# Patient Record
Sex: Female | Born: 1995 | Race: Black or African American | Hispanic: No | Marital: Single | Smoking: Current some day smoker
Health system: Southern US, Community
[De-identification: ages and names within clinical notes are randomized; demographics above are authoritative.]

## PROBLEM LIST (undated history)

## (undated) ENCOUNTER — Inpatient Hospital Stay (HOSPITAL_COMMUNITY): Payer: Self-pay

## (undated) ENCOUNTER — Ambulatory Visit: Source: Home / Self Care

## (undated) DIAGNOSIS — Z3490 Encounter for supervision of normal pregnancy, unspecified, unspecified trimester: Secondary | ICD-10-CM

## (undated) DIAGNOSIS — D649 Anemia, unspecified: Secondary | ICD-10-CM

## (undated) DIAGNOSIS — N390 Urinary tract infection, site not specified: Secondary | ICD-10-CM

---
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
## 2017-01-29 ENCOUNTER — Ambulatory Visit: Payer: Medicaid Other

## 2017-02-05 ENCOUNTER — Ambulatory Visit (INDEPENDENT_AMBULATORY_CARE_PROVIDER_SITE_OTHER): Payer: Medicaid Other | Admitting: General Practice

## 2017-02-05 VITALS — BP 115/63 | HR 53 | Ht 64.0 in | Wt 93.0 lb

## 2017-02-05 DIAGNOSIS — Z3042 Encounter for surveillance of injectable contraceptive: Secondary | ICD-10-CM | POA: Diagnosis present

## 2017-02-05 MED ORDER — MEDROXYPROGESTERONE ACETATE 150 MG/ML IM SUSP
150.0000 mg | Freq: Once | INTRAMUSCULAR | Status: AC
  Administered 2017-02-05: 150 mg via INTRAMUSCULAR

## 2017-02-11 ENCOUNTER — Inpatient Hospital Stay (HOSPITAL_COMMUNITY)
Admission: AD | Admit: 2017-02-11 | Discharge: 2017-02-11 | Disposition: A | Discharge status: Home / Self Care | Payer: Medicaid Other | Source: Ambulatory Visit | Attending: Obstetrics & Gynecology | Admitting: Obstetrics & Gynecology

## 2017-02-11 ENCOUNTER — Encounter (HOSPITAL_COMMUNITY): Payer: Self-pay

## 2017-02-11 DIAGNOSIS — Z3202 Encounter for pregnancy test, result negative: Secondary | ICD-10-CM | POA: Insufficient documentation

## 2017-02-11 DIAGNOSIS — N390 Urinary tract infection, site not specified: Secondary | ICD-10-CM

## 2017-02-11 DIAGNOSIS — R300 Dysuria: Secondary | ICD-10-CM | POA: Diagnosis present

## 2017-02-11 HISTORY — DX: Anemia, unspecified: D64.9

## 2017-02-11 HISTORY — DX: Urinary tract infection, site not specified: N39.0

## 2017-02-11 LAB — URINALYSIS, ROUTINE W REFLEX MICROSCOPIC
GLUCOSE, UA: NEGATIVE mg/dL
KETONES UR: 5 mg/dL — AB
Nitrite: POSITIVE — AB
Specific Gravity, Urine: 1.033 — ABNORMAL HIGH (ref 1.005–1.030)
pH: 5 (ref 5.0–8.0)

## 2017-02-11 LAB — COMPREHENSIVE METABOLIC PANEL
ALBUMIN: 4.5 g/dL (ref 3.5–5.0)
ALK PHOS: 49 U/L (ref 38–126)
ALT: 30 U/L (ref 14–54)
AST: 23 U/L (ref 15–41)
Anion gap: 10 (ref 5–15)
BILIRUBIN TOTAL: 1.5 mg/dL — AB (ref 0.3–1.2)
BUN: 13 mg/dL (ref 6–20)
CO2: 22 mmol/L (ref 22–32)
CREATININE: 0.77 mg/dL (ref 0.44–1.00)
Calcium: 9.4 mg/dL (ref 8.9–10.3)
Chloride: 105 mmol/L (ref 101–111)
GFR calc Af Amer: >60 mL/min (ref >60)
GLUCOSE: 103 mg/dL — AB (ref 65–99)
Potassium: 4.1 mmol/L (ref 3.5–5.1)
SODIUM: 137 mmol/L (ref 135–145)
TOTAL PROTEIN: 8.1 g/dL (ref 6.5–8.1)

## 2017-02-11 LAB — CBC WITH DIFFERENTIAL/PLATELET
BASOS ABS: 0 10*3/uL (ref 0.0–0.1)
BASOS PCT: 0 %
EOS ABS: 0 10*3/uL (ref 0.0–0.7)
Eosinophils Relative: 0 %
HCT: 35.6 % — ABNORMAL LOW (ref 36.0–46.0)
Hemoglobin: 11.6 g/dL — ABNORMAL LOW (ref 12.0–15.0)
Lymphocytes Relative: 15 %
Lymphs Abs: 1.6 10*3/uL (ref 0.7–4.0)
MCH: 28.6 pg (ref 26.0–34.0)
MCHC: 32.6 g/dL (ref 30.0–36.0)
MCV: 87.7 fL (ref 78.0–100.0)
MONO ABS: 0.6 10*3/uL (ref 0.1–1.0)
MONOS PCT: 5 %
NEUTROS PCT: 80 %
Neutro Abs: 8.9 10*3/uL — ABNORMAL HIGH (ref 1.7–7.7)
Platelets: 203 10*3/uL (ref 150–400)
RBC: 4.06 MIL/uL (ref 3.87–5.11)
RDW: 14.6 % (ref 11.5–15.5)
WBC: 11.2 10*3/uL — ABNORMAL HIGH (ref 4.0–10.5)

## 2017-02-11 LAB — POCT PREGNANCY, URINE: PREG TEST UR: NEGATIVE

## 2017-02-11 MED ORDER — CIPROFLOXACIN HCL 500 MG PO TABS: 500.0000 mg | ORAL_TABLET | Freq: Two times a day (BID) | ORAL | 0 refills | Status: AC

## 2017-02-11 MED ORDER — CEFTRIAXONE SODIUM 1 G IJ SOLR
1.0000 g | Freq: Once | INTRAMUSCULAR | Status: DC
  Filled 2017-02-11: qty 10

## 2017-02-11 MED ORDER — ACETAMINOPHEN 500 MG PO TABS
1000.0000 mg | ORAL_TABLET | Freq: Once | ORAL | Status: AC
  Administered 2017-02-11: 1000 mg via ORAL
  Filled 2017-02-11: qty 2

## 2017-02-11 MED ORDER — CEFTRIAXONE SODIUM 1 G IJ SOLR
1.0000 g | Freq: Once | INTRAMUSCULAR | Status: AC
  Administered 2017-02-11: 1 g via INTRAMUSCULAR
  Filled 2017-02-11: qty 10

## 2017-02-11 NOTE — Discharge Instructions (Signed)
Pyelonephritis, Adult Pyelonephritis is a kidney infection. The kidneys are organs that help clean your blood by moving waste out of your blood and into your pee (urine). This infection can happen quickly, or it can last for a long time. In most cases, it clears up with treatment and does not cause other problems. Follow these instructions at home: Medicines  Take over-the-counter and prescription medicines only as told by your doctor.  Take your antibiotic medicine as told by your doctor. Do not stop taking the medicine even if you start to feel better. General instructions  Drink enough fluid to keep your pee clear or pale yellow.  Avoid caffeine, tea, and carbonated drinks.  Pee (urinate) often. Avoid holding in pee for long periods of time.  Pee before and after sex.  After pooping (having a bowel movement), women should wipe from front to back. Use each tissue only once.  Keep all follow-up visits as told by your doctor. This is important. Contact a doctor if:  You do not feel better after 2 days.  Your symptoms get worse.  You have a fever. Get help right away if:  You cannot take your medicine or drink fluids as told.  You have chills and shaking.  You throw up (vomit).  You have very bad pain in your side (flank) or back.  You feel very weak or you pass out (faint). This information is not intended to replace advice given to you by your health care provider. Make sure you discuss any questions you have with your health care provider. Document Released: 03/28/2004 Document Revised: 07/27/2015 Document Reviewed: 06/13/2014 Elsevier Interactive Patient Education  [employer]2018 Elsevier Inc.   In late 2019, the [facility] will be [facility] to the [facility]. At that time, the MAU (Maternity Admissions Unit), where you are being seen today, will no longer take care of non-pregnant patients. We strongly encourage you to find a doctor's office before that time, so that  you can be seen with any GYN concerns, like vaginal discharge, urinary tract infection, etc.. in a timely manner.  In order to make an office visit more convenient, the Center for [facility] at [facility] will be offering evening hours with same-day appointments, walk-in appointments and scheduled appointments available during this time.  Center for [facility] Hours: Monday - 8am - 7:30 pm with walk-in between 4pm- 7:30 pm Tuesday - 8 am - 5 pm (starting 06/03/17 we will be open late and accepting walk-ins from 4pm - 7:30pm) Wednesday - 8 am - 5 pm (starting 09/03/17 we will be open late and accepting walk-ins from 4pm - 7:30pm) Thursday 8 am - 5 pm (starting 12/04/17 we will be open late and accepting walk-ins from 4pm - 7:30pm) Friday 8 am - 5 pm  For an appointment please call the Center for [facility] @ [facility] at [phone]  For urgent needs, [facility] is also available for management of urgent GYN complaints such as vaginal discharge or urinary tract infections.  Primary care follow up  [facility] (will see you even if you do not have sickle cell): [phone] [facility]: [phone] [facility]: [phone]

## 2017-02-11 NOTE — MAU Note (Signed)
Pt started having UTI symptoms 3 days ago. Having chills and vomiting today.

## 2017-02-11 NOTE — MAU Provider Note (Signed)
History  [name]: [id number] Arrival date and time: 02/11/17 1718  First Provider Initiated Contact with Patient 02/11/17 1824      Chief Complaint  Patient presents with  . UTI symptoms    HPI: [name] is a 21 y.o. who presents to maternity admissions reporting dysuria, flank pain, and chills. She reports she has had dysuria and urinary frequency for about 2 weeks, then started having right-sided flank pain, and chills 3 days ago. Thinks she has had a fever, but did not check. Some nausea and had one episode of emesis earlier today. Denies diarrhea, vaginal discharge, vaginal bleeding, dizziness/lighreadhess, or headache.    OB History  Gravida Para Term Preterm AB Living  2 1 1   1 1   SAB TAB Ectopic Multiple Live Births  1     0 1    # Outcome Date GA Lbr Len/2nd Weight Sex Delivery Anes PTL Lv  2 Term 01/27/15 [id number]  7 lb 8.3 oz (3.41 kg) M CS-LTranv Spinal  LIV  1 SAB 2013             Past Medical History:  Diagnosis Date  . Anemia   . UTI (urinary tract infection)    Past Surgical History:  Procedure Laterality Date  . CESAREAN SECTION N/A 01/27/2015   Procedure: CESAREAN SECTION;  Surgeon: Levie HeritageJacob J Stinson, DO;  Location: [facility];  Service: Obstetrics;  Laterality: N/A;   Social History   Socioeconomic History  . Marital status: Single    Spouse name: Not on file  . Number of children: Not on file  . Years of education: Not on file  . Highest education level: Not on file  Social Needs  . Financial resource strain: Not on file  . Food insecurity - worry: Not on file  . Food insecurity - inability: Not on file  . Transportation needs - medical: Not on file  . Transportation needs - non-medical: Not on file  Occupational History  . Not on file  Tobacco Use  . Smoking status: Never Smoker  . Smokeless tobacco: Never Used  Substance and Sexual Activity  . Alcohol use: No  . Drug use: Yes    Types: Marijuana  . Sexual activity: Yes    Birth  control/protection: Condom, Injection    Comment: next Depo scheduled for April 1st  Other Topics Concern  . Not on file  Social History Narrative   ** Merged History Encounter **       No Known Allergies  No medications prior to admission.    I have reviewed patient's Past Medical Hx, Surgical Hx, Family Hx, Social Hx, medications and allergies.   Review of Systems: Negative except for what is mentioned in HPI.  Physical Exam   Blood pressure 123/67, pulse (!) 103, temperature (!) 101.3 F (38.5 C), resp. rate 16, last menstrual period 01/09/2017, SpO2 100 %.  Constitutional: Well-developed, well-nourished female in no acute distress.  HENT: [place]/AT, normal oropharynx mucosa. MMM Eyes: normal conjunctivae, no scleral icterus Cardiovascular: normal rate (90s), regular rhythm Respiratory: normal effort, lungs CTAB.  GI: Abd soft, non-tender, non-distended, normoactive bowel sounds GU: Mild right CVA tenderness.  MSK: Extremities nontender, no edema Neurologic: Alert and oriented x 4. Psych: Normal mood and affect Skin: warm and dry  MAU Course/MDM:   Nursing notes and VS reviewed.  Patient seen and examined, as noted above.  UA reviewed: c/w UTI. Urine sent for culture.  UPT negative CBC, CMP ordered.   Results  reviewed:  Results for orders placed or performed during the hospital encounter of 02/11/17  Urinalysis, Routine w reflex microscopic  Result Value Ref Range   Color, Urine AMBER (A) YELLOW   APPearance HAZY (A) CLEAR   Specific Gravity, Urine 1.033 (H) 1.005 - 1.030   pH 5.0 5.0 - 8.0   Glucose, UA NEGATIVE NEGATIVE mg/dL   Hgb urine dipstick MODERATE (A) NEGATIVE   Bilirubin Urine SMALL (A) NEGATIVE   Ketones, ur 5 (A) NEGATIVE mg/dL   Protein, ur >=811 (A) NEGATIVE mg/dL   Nitrite POSITIVE (A) NEGATIVE   Leukocytes, UA MODERATE (A) NEGATIVE   RBC / HPF TOO NUMEROUS TO COUNT 0 - 5 RBC/hpf   WBC, UA TOO NUMEROUS TO COUNT 0 - 5 WBC/hpf   Bacteria, UA  MANY (A) NONE SEEN   Squamous Epithelial / LPF 0-5 (A) NONE SEEN   Mucus PRESENT    Hyaline Casts, UA PRESENT    Non Squamous Epithelial 0-5 (A) NONE SEEN  CBC with Differential/Platelet  Result Value Ref Range   WBC 11.2 (H) 4.0 - 10.5 K/uL   RBC 4.06 3.87 - 5.11 MIL/uL   Hemoglobin 11.6 (L) 12.0 - 15.0 g/dL   HCT 91.4 (L) 78.2 - 95.6 %   MCV 87.7 78.0 - 100.0 fL   MCH 28.6 26.0 - 34.0 pg   MCHC 32.6 30.0 - 36.0 g/dL   RDW 21.3 08.6 - 57.8 %   Platelets 203 150 - 400 K/uL   Neutrophils Relative % 80 %   Neutro Abs 8.9 (H) 1.7 - 7.7 K/uL   Lymphocytes Relative 15 %   Lymphs Abs 1.6 0.7 - 4.0 K/uL   Monocytes Relative 5 %   Monocytes Absolute 0.6 0.1 - 1.0 K/uL   Eosinophils Relative 0 %   Eosinophils Absolute 0.0 0.0 - 0.7 K/uL   Basophils Relative 0 %   Basophils Absolute 0.0 0.0 - 0.1 K/uL  Comprehensive metabolic panel  Result Value Ref Range   Sodium 137 135 - 145 mmol/L   Potassium 4.1 3.5 - 5.1 mmol/L   Chloride 105 101 - 111 mmol/L   CO2 22 22 - 32 mmol/L   Glucose, Bld 103 (H) 65 - 99 mg/dL   BUN 13 6 - 20 mg/dL   Creatinine, Ser 4.69 0.44 - 1.00 mg/dL   Calcium 9.4 8.9 - 62.9 mg/dL   Total Protein 8.1 6.5 - 8.1 g/dL   Albumin 4.5 3.5 - 5.0 g/dL   AST 23 15 - 41 U/L   ALT 30 14 - 54 U/L   Alkaline Phosphatase 49 38 - 126 U/L   Total Bilirubin 1.5 (H) 0.3 - 1.2 mg/dL   GFR calc non Af Amer >60 >60 mL/min   GFR calc Af Amer >60 >60 mL/min   Anion gap 10 5 - 15  Pregnancy, urine POC  Result Value Ref Range   Preg Test, Ur NEGATIVE NEGATIVE   21 y.o. nonpregnant female presenting with signs and symptoms of acute complicated UTI. She febrile (Tmax 101.3), with heart mildly appropriately increased, which improved after treatement of fever; normal RR. mild leukocytosis (11.2k); does not meet criteria for sepsis, and qSOFA is 0 (GCS 15, RR < 22, SBP >100). UA notable for pyuria, +nitrites. Mild right CVA. Presentation c/w with acute complicated UTI, likely mild/early  pyelonephritis. Pt with no indication for hospitalization (uncontrolled pain, marked debility, inability to tolerate po, or sepsis), and is a good candidate for outpatient treatment --Rocephin 1 g  given  --Will prescribe ciprofloxin 500 mg po BID x 7 days for outpatient treatment    Assessment and Plan  Assessment: 1. Complicated UTI (urinary tract infection)     Plan: --Rocephin 1 g here followed by ciprofloxacin x 7 days outpatient --Discharge home in stable condition --Discussed return precautions at length, including pain uncontrolled with OTC analgesics, inability to tolerate po, persistent high fever, or other concerning symptoms.  [place] Haydon, Kandra NicolasJulie P, MD 02/11/2017 6:24 PM

## 2017-02-14 LAB — URINE CULTURE: Culture: >=100000 — AB

## 2017-04-23 ENCOUNTER — Ambulatory Visit (INDEPENDENT_AMBULATORY_CARE_PROVIDER_SITE_OTHER): Payer: Medicaid Other | Admitting: *Deleted

## 2017-04-23 VITALS — BP 115/72 | HR 66

## 2017-04-23 DIAGNOSIS — Z3042 Encounter for surveillance of injectable contraceptive: Secondary | ICD-10-CM | POA: Diagnosis present

## 2017-04-23 MED ORDER — MEDROXYPROGESTERONE ACETATE 150 MG/ML IM SUSP
150.0000 mg | Freq: Once | INTRAMUSCULAR | Status: AC
  Administered 2017-04-23: 150 mg via INTRAMUSCULAR

## 2017-04-23 NOTE — Progress Notes (Signed)
I have reviewed this chart and agree with the RN/CMA assessment and management.    K. Meryl Davis, M.D. Attending Obstetrician & Gynecologist, [facility] Practice [facility], [facility]  

## 2017-06-01 ENCOUNTER — Inpatient Hospital Stay (HOSPITAL_COMMUNITY)
Admission: AD | Admit: 2017-06-01 | Discharge: 2017-06-01 | Disposition: A | Discharge status: Home / Self Care | Payer: Medicaid Other | Source: Ambulatory Visit | Attending: Obstetrics and Gynecology | Admitting: Obstetrics and Gynecology

## 2017-06-01 DIAGNOSIS — R300 Dysuria: Secondary | ICD-10-CM | POA: Diagnosis present

## 2017-06-01 LAB — URINALYSIS, ROUTINE W REFLEX MICROSCOPIC
BILIRUBIN URINE: NEGATIVE
GLUCOSE, UA: NEGATIVE mg/dL
KETONES UR: NEGATIVE mg/dL
Nitrite: NEGATIVE
PH: 7 (ref 5.0–8.0)
Protein, ur: NEGATIVE mg/dL
SPECIFIC GRAVITY, URINE: 1.011 (ref 1.005–1.030)

## 2017-06-01 LAB — POCT PREGNANCY, URINE: Preg Test, Ur: NEGATIVE

## 2017-06-01 MED ORDER — NITROFURANTOIN MONOHYD MACRO 100 MG PO CAPS: 100.0000 mg | ORAL_CAPSULE | Freq: Two times a day (BID) | ORAL | 0 refills | Status: DC

## 2017-06-01 NOTE — MAU Provider Note (Signed)
THE [facility] OF [place] MATERNITY ADMISSIONS Provider Note   [name]: [id number] Arrival date & time: 06/01/17  1409     History   Chief Complaint Chief Complaint  Patient presents with  . Dysuria    HPI Monica Nash is a 22 y.o. female G2P1011 who presents to the ED with dysuria that started a few days ago. Patient reports that she has had UTI's in the past and this feels the same. Patient states she is not concerned about STI's and does not need a pelvic exam or other evaluation.   HPI  Past Medical History:  Diagnosis Date  . Anemia   . UTI (urinary tract infection)     Patient Active Problem List   Diagnosis Date Noted  . S/P primary low transverse C-section 01/27/2015    Past Surgical History:  Procedure Laterality Date  . CESAREAN SECTION N/A 01/27/2015   Procedure: CESAREAN SECTION;  Surgeon: Levie Heritage, DO;  Location: [facility];  Service: Obstetrics;  Laterality: N/A;     OB History    Gravida  2   Para  1   Term  1   Preterm      AB  1   Living  1     SAB  1   TAB      Ectopic      Multiple  0   Live Births  1            Home Medications    Prior to Admission medications   Medication Sig Start Date End Date Taking? Authorizing Provider  nitrofurantoin, macrocrystal-monohydrate, (MACROBID) 100 MG capsule Take 1 capsule (100 mg total) by mouth 2 (two) times daily. 06/01/17   Janne Napoleon, NP    Family History No family history on file.  Social History Social History   Tobacco Use  . Smoking status: Never Smoker  . Smokeless tobacco: Never Used  Substance Use Topics  . Alcohol use: No  . Drug use: Yes    Types: Marijuana     Allergies   Patient has no known allergies.   Review of Systems Review of Systems  Genitourinary: Positive for dysuria.  All other systems reviewed and are negative.    Physical Exam Updated Vital Signs BP (!) 106/46 (BP Location: Right Arm)   Pulse 75   Temp 99.1 F (37.3  C) (Oral)   Resp 16   Ht 5\' 4"  (1.626 m)   Wt 97 lb (44 kg)   BMI 16.65 kg/m   Physical Exam  Constitutional: She appears well-developed and well-nourished. No distress.  HENT:  Head: Normocephalic.  Eyes: EOM are normal.  Neck: Neck supple.  Cardiovascular: Normal rate.  Pulmonary/Chest: Effort normal.  Abdominal: Soft. There is no tenderness.  Musculoskeletal: Normal range of motion.  Neurological: She is alert.  Skin: Skin is warm and dry.  Psychiatric: She has a normal mood and affect.  Nursing note and vitals reviewed.    ED Treatments / Results  Labs (all labs ordered are listed, but only abnormal results are displayed) Labs Reviewed  URINALYSIS, ROUTINE W REFLEX MICROSCOPIC - Abnormal; Notable for the following components:      Result Value   Hgb urine dipstick SMALL (*)    Leukocytes, UA TRACE (*)    Bacteria, UA RARE (*)    Squamous Epithelial / LPF 0-5 (*)    All other components within normal limits  URINE CULTURE  POCT PREGNANCY, URINE    Initial  Impression / Assessment and Plan / ED Course  I have reviewed the triage vital signs and the nursing notes. Pt has been diagnosed with dysuria. Urinalysis no conclusive for UTI but will send for culture and start Macrobid.  Pt is afebrile, no CVA tenderness, normotensive, and denies N/V. Pt to be dc home and instructions to follow up with PCP if symptoms persist.  Final Clinical Impressions(s) / ED Diagnoses   Final diagnoses:  Dysuria    ED Discharge Orders        Ordered    nitrofurantoin, macrocrystal-monohydrate, (MACROBID) 100 MG capsule  2 times daily     06/01/17 1504    Diet - low sodium heart healthy     06/01/17 1509

## 2017-06-01 NOTE — MAU Note (Signed)
Patient presents onset of painful urination x 1 week, urinary frequency.

## 2017-06-01 NOTE — Discharge Instructions (Signed)

## 2017-06-02 LAB — URINE CULTURE: Special Requests: NORMAL

## 2017-07-09 ENCOUNTER — Ambulatory Visit (INDEPENDENT_AMBULATORY_CARE_PROVIDER_SITE_OTHER): Payer: Medicaid Other | Admitting: General Practice

## 2017-07-09 VITALS — BP 125/59 | HR 54 | Ht 64.0 in | Wt 97.0 lb

## 2017-07-09 DIAGNOSIS — Z3042 Encounter for surveillance of injectable contraceptive: Secondary | ICD-10-CM | POA: Diagnosis present

## 2017-07-09 MED ORDER — MEDROXYPROGESTERONE ACETATE 150 MG/ML IM SUSP: 150.0000 mg | INTRAMUSCULAR | 0 refills | Status: DC

## 2017-07-09 MED ORDER — MEDROXYPROGESTERONE ACETATE 150 MG/ML IM SUSP
150.0000 mg | Freq: Once | INTRAMUSCULAR | Status: AC
  Administered 2017-07-09: 150 mg via INTRAMUSCULAR

## 2017-07-09 NOTE — Progress Notes (Signed)
[name] here for Depo-Provera  Injection.  Injection administered without complication. Patient will return in 3 months for next injection.  Marylynn Pearson, RN 07/09/2017  2:39 PM

## 2017-07-10 NOTE — Progress Notes (Signed)
I have reviewed the chart and agree with nursing staff's documentation of this patient's encounter.  Moustafa Mossa, MD 07/10/2017 10:14 AM    

## 2017-10-08 ENCOUNTER — Ambulatory Visit (INDEPENDENT_AMBULATORY_CARE_PROVIDER_SITE_OTHER): Payer: Medicaid Other

## 2017-10-08 VITALS — Wt 96.1 lb

## 2017-10-08 DIAGNOSIS — Z3042 Encounter for surveillance of injectable contraceptive: Secondary | ICD-10-CM

## 2017-10-08 MED ORDER — MEDROXYPROGESTERONE ACETATE 150 MG/ML IM SUSP
150.0000 mg | Freq: Once | INTRAMUSCULAR | Status: AC
Start: 2017-10-08 — End: 2017-10-08
  Administered 2017-10-08: 150 mg via INTRAMUSCULAR

## 2017-10-08 NOTE — Progress Notes (Addendum)
Rolene CourseMoesha Feinberg here for Depo-Provera  Injection.  Injection administered without complication. Patient will return in 3 months for next injection.  Advised to pt will need annual exam to cont. Depo Injection the patient verbalized understanding.  Henrietta Dineamela S Revecca Nachtigal, CMA 10/08/2017  4:08 PM

## 2017-10-09 NOTE — Progress Notes (Signed)
Chart reviewed - agree with cma documentation.

## 2017-11-09 ENCOUNTER — Inpatient Hospital Stay (EMERGENCY_DEPARTMENT_HOSPITAL)
Admission: AD | Admit: 2017-11-09 | Discharge: 2017-11-10 | Disposition: A | Discharge status: Home / Self Care | Payer: Medicaid Other | Source: Ambulatory Visit | Attending: Obstetrics and Gynecology | Admitting: Obstetrics and Gynecology

## 2017-11-09 ENCOUNTER — Other Ambulatory Visit: Payer: Self-pay

## 2017-11-09 DIAGNOSIS — R079 Chest pain, unspecified: Secondary | ICD-10-CM | POA: Diagnosis present

## 2017-11-09 DIAGNOSIS — R0602 Shortness of breath: Secondary | ICD-10-CM | POA: Diagnosis present

## 2017-11-09 LAB — URINALYSIS, ROUTINE W REFLEX MICROSCOPIC
Glucose, UA: NEGATIVE mg/dL
KETONES UR: NEGATIVE mg/dL
LEUKOCYTES UA: NEGATIVE
Nitrite: NEGATIVE
PH: 6 (ref 5.0–8.0)
PROTEIN: 30 mg/dL — AB
Specific Gravity, Urine: 1.03 (ref 1.005–1.030)

## 2017-11-09 LAB — POCT PREGNANCY, URINE: Preg Test, Ur: NEGATIVE

## 2017-11-09 NOTE — MAU Note (Signed)
Pt not in lobby.  

## 2017-11-09 NOTE — MAU Note (Signed)
Pt reports n/v and upper abdominal pain for a couple for weeks. Pt took ibuprofen with no relief. Pt also feeling dizziness and back pain and shaky. Pt was given depo last month in clinic.

## 2017-11-09 NOTE — MAU Provider Note (Signed)
Dr. Precious Reel was paged and [phone] // TC returned @ 2341 to discuss EKG results. Dr. Katherene Ponto recommendation is that CNM send patient to [facility] for further evaluation of CP.   2345 - patient not in lobby when called to triage room.  Raelyn Mora, CNM

## 2017-11-10 ENCOUNTER — Encounter (HOSPITAL_COMMUNITY): Payer: Self-pay | Admitting: *Deleted

## 2017-11-10 ENCOUNTER — Other Ambulatory Visit: Payer: Self-pay

## 2017-11-10 ENCOUNTER — Inpatient Hospital Stay (HOSPITAL_COMMUNITY)
Admission: EM | Admit: 2017-11-10 | Discharge: 2017-11-12 | DRG: 419 | Disposition: A | Discharge status: Home / Self Care | Payer: Medicaid Other | Attending: General Surgery | Admitting: General Surgery

## 2017-11-10 DIAGNOSIS — Z793 Long term (current) use of hormonal contraceptives: Secondary | ICD-10-CM

## 2017-11-10 DIAGNOSIS — R112 Nausea with vomiting, unspecified: Secondary | ICD-10-CM | POA: Diagnosis present

## 2017-11-10 DIAGNOSIS — D649 Anemia, unspecified: Secondary | ICD-10-CM | POA: Diagnosis present

## 2017-11-10 DIAGNOSIS — R079 Chest pain, unspecified: Secondary | ICD-10-CM | POA: Diagnosis not present

## 2017-11-10 DIAGNOSIS — R740 Nonspecific elevation of levels of transaminase and lactic acid dehydrogenase [LDH]: Secondary | ICD-10-CM

## 2017-11-10 DIAGNOSIS — K8064 Calculus of gallbladder and bile duct with chronic cholecystitis without obstruction: Principal | ICD-10-CM | POA: Diagnosis present

## 2017-11-10 DIAGNOSIS — R7401 Elevation of levels of liver transaminase levels: Secondary | ICD-10-CM

## 2017-11-10 DIAGNOSIS — K8050 Calculus of bile duct without cholangitis or cholecystitis without obstruction: Secondary | ICD-10-CM | POA: Diagnosis present

## 2017-11-10 DIAGNOSIS — E876 Hypokalemia: Secondary | ICD-10-CM | POA: Diagnosis present

## 2017-11-10 DIAGNOSIS — R109 Unspecified abdominal pain: Secondary | ICD-10-CM

## 2017-11-10 DIAGNOSIS — R1011 Right upper quadrant pain: Secondary | ICD-10-CM

## 2017-11-10 DIAGNOSIS — R748 Abnormal levels of other serum enzymes: Secondary | ICD-10-CM | POA: Diagnosis present

## 2017-11-10 DIAGNOSIS — R0602 Shortness of breath: Secondary | ICD-10-CM | POA: Diagnosis present

## 2017-11-10 DIAGNOSIS — Z419 Encounter for procedure for purposes other than remedying health state, unspecified: Secondary | ICD-10-CM

## 2017-11-10 LAB — COMPREHENSIVE METABOLIC PANEL
ALBUMIN: 5.1 g/dL — AB (ref 3.5–5.0)
ALT: 508 U/L — ABNORMAL HIGH (ref 0–44)
ANION GAP: 11 (ref 5–15)
AST: 223 U/L — ABNORMAL HIGH (ref 15–41)
Alkaline Phosphatase: 125 U/L (ref 38–126)
BUN: 10 mg/dL (ref 6–20)
CO2: 28 mmol/L (ref 22–32)
Calcium: 10 mg/dL (ref 8.9–10.3)
Chloride: 103 mmol/L (ref 98–111)
Creatinine, Ser: 0.68 mg/dL (ref 0.44–1.00)
GFR calc non Af Amer: >60 mL/min (ref >60)
GLUCOSE: 98 mg/dL (ref 70–99)
POTASSIUM: 3.3 mmol/L — AB (ref 3.5–5.1)
SODIUM: 142 mmol/L (ref 135–145)
Total Bilirubin: 2.5 mg/dL — ABNORMAL HIGH (ref 0.3–1.2)
Total Protein: 8.2 g/dL — ABNORMAL HIGH (ref 6.5–8.1)

## 2017-11-10 LAB — CBC
HEMATOCRIT: 36.6 % (ref 36.0–46.0)
HEMOGLOBIN: 12.1 g/dL (ref 12.0–15.0)
MCH: 28.6 pg (ref 26.0–34.0)
MCHC: 33.1 g/dL (ref 30.0–36.0)
MCV: 86.5 fL (ref 78.0–100.0)
Platelets: 244 10*3/uL (ref 150–400)
RBC: 4.23 MIL/uL (ref 3.87–5.11)
RDW: 14.5 % (ref 11.5–15.5)
WBC: 6 10*3/uL (ref 4.0–10.5)

## 2017-11-10 LAB — URINALYSIS, ROUTINE W REFLEX MICROSCOPIC
Glucose, UA: NEGATIVE mg/dL
Ketones, ur: NEGATIVE mg/dL
Leukocytes, UA: NEGATIVE
Nitrite: NEGATIVE
Protein, ur: 100 mg/dL — AB
Specific Gravity, Urine: 1.03 (ref 1.005–1.030)
pH: 6 (ref 5.0–8.0)

## 2017-11-10 LAB — PREGNANCY, URINE: Preg Test, Ur: NEGATIVE

## 2017-11-10 LAB — LIPASE, BLOOD: Lipase: 26 U/L (ref 11–51)

## 2017-11-10 MED ORDER — KETOROLAC TROMETHAMINE 30 MG/ML IJ SOLN
30.0000 mg | Freq: Once | INTRAMUSCULAR | Status: AC
  Administered 2017-11-11: 30 mg via INTRAVENOUS
  Filled 2017-11-10: qty 1

## 2017-11-10 MED ORDER — SODIUM CHLORIDE 0.9 % IV BOLUS
1000.0000 mL | Freq: Once | INTRAVENOUS | Status: AC
  Administered 2017-11-11: 1000 mL via INTRAVENOUS

## 2017-11-10 NOTE — MAU Provider Note (Signed)
Chief Complaint: Abdominal Pain; Nausea; and Emesis   None     SUBJECTIVE HPI: [name] is a 22 y.o. G2P1011 who presents to maternity admissions for complaints of abdominal cramps, chest pain, SOB, nausea, upper abdominal pain, & lightheadedness. She states the only way to relieve the upper abdominal pain is to induce vomiting. Otherwise, she has continued upper abdominal pain. She reports "feeling jittery, too."  Past Medical History:  Diagnosis Date  . Anemia   . UTI (urinary tract infection)    Past Surgical History:  Procedure Laterality Date  . CESAREAN SECTION N/A 01/27/2015   Procedure: CESAREAN SECTION;  Surgeon: Levie Heritage, DO;  Location: [facility];  Service: Obstetrics;  Laterality: N/A;   Social History   Socioeconomic History  . Marital status: Single    Spouse name: Not on file  . Number of children: Not on file  . Years of education: Not on file  . Highest education level: Not on file  Occupational History  . Not on file  Social Needs  . Financial resource strain: Not on file  . Food insecurity:    Worry: Not on file    Inability: Not on file  . Transportation needs:    Medical: Not on file    Non-medical: Not on file  Tobacco Use  . Smoking status: Never Smoker  . Smokeless tobacco: Never Used  Substance and Sexual Activity  . Alcohol use: No  . Drug use: Yes    Types: Marijuana  . Sexual activity: Yes    Birth control/protection: Condom, Injection    Comment: next Depo scheduled for April 1st  Lifestyle  . Physical activity:    Days per week: Not on file    Minutes per session: Not on file  . Stress: Not on file  Relationships  . Social connections:    Talks on phone: Not on file    Gets together: Not on file    Attends religious service: Not on file    Active member of club or organization: Not on file    Attends meetings of clubs or organizations: Not on file    Relationship status: Not on file  . Intimate partner violence:   Fear of current or ex partner: Not on file    Emotionally abused: Not on file    Physically abused: Not on file    Forced sexual activity: Not on file  Other Topics Concern  . Not on file  Social History Narrative   ** Merged History Encounter **       No current facility-administered medications on file prior to encounter.    Current Outpatient Medications on File Prior to Encounter  Medication Sig Dispense Refill  . medroxyPROGESTERone (DEPO-PROVERA) 150 MG/ML injection Inject 150 mg into the muscle every 3 (three) months.     No Known Allergies  ROS:  Review of Systems  Constitutional: Negative.   HENT: Negative.   Eyes: Negative.   Respiratory: Positive for shortness of breath.        Chest pain  Cardiovascular: Positive for chest pain.  Gastrointestinal: Positive for abdominal pain. Abdominal distention: upper.  Endocrine: Negative.   Genitourinary: Negative.   Musculoskeletal: Negative.   Skin: Negative.   Allergic/Immunologic: Negative.   Neurological: Positive for light-headedness.  Hematological: Negative.   Psychiatric/Behavioral: Negative.     I have reviewed patient's Past Medical Hx, Surgical Hx, Family Hx, Social Hx, medications and allergies.   Physical Exam   Patient Vitals  for the past 24 hrs:  BP Temp Temp src Pulse Resp Height Weight  11/09/17 2158 114/68 98.5 F (36.9 C) Oral (!) 56 16 5\' 4"  (1.626 m) 43.1 kg   Physical Exam  Constitutional: She is oriented to person, place, and time. She appears well-developed and well-nourished.  HENT:  Head: Normocephalic and atraumatic.  Eyes: Pupils are equal, round, and reactive to light.  Neck: Normal range of motion. Neck supple.  Cardiovascular: Normal rate, regular rhythm and normal heart sounds.  Respiratory: Effort normal and breath sounds normal.    Pain in epigastric and RT upper abdomen  GI: Soft. Bowel sounds are normal.  Musculoskeletal: Normal range of motion.  Neurological: She is alert  and oriented to person, place, and time.  Skin: Skin is warm and dry.  Psychiatric: She has a normal mood and affect. Her behavior is normal. Judgment and thought content normal.    MDM Patient report continued CP & SOB with exertion and "fast heartbeat. Patient advised that she may wait for a room in MAU or may choose to be discharged to seek non-emergent evaluation of her complaint at [facility], Urgent care or her PCP.   Results for orders placed or performed during the hospital encounter of 11/09/17 (from the past 24 hour(s))  Urinalysis, Routine w reflex microscopic     Status: Abnormal   Collection Time: 11/09/17 10:01 PM  Result Value Ref Range   Color, Urine AMBER (A) YELLOW   APPearance CLEAR CLEAR   Specific Gravity, Urine 1.030 1.005 - 1.030   pH 6.0 5.0 - 8.0   Glucose, UA NEGATIVE NEGATIVE mg/dL   Hgb urine dipstick SMALL (A) NEGATIVE   Bilirubin Urine SMALL (A) NEGATIVE   Ketones, ur NEGATIVE NEGATIVE mg/dL   Protein, ur 30 (A) NEGATIVE mg/dL   Nitrite NEGATIVE NEGATIVE   Leukocytes, UA NEGATIVE NEGATIVE   RBC / HPF 11-20 0 - 5 RBC/hpf   WBC, UA 6-10 0 - 5 WBC/hpf   Bacteria, UA RARE (A) NONE SEEN   Squamous Epithelial / LPF 6-10 0 - 5   Mucus PRESENT   Pregnancy, urine POC     Status: None   Collection Time: 11/09/17 10:15 PM  Result Value Ref Range   Preg Test, Ur NEGATIVE NEGATIVE    ASSESSMENT MSE Complete Chest Pain SOB  PLAN Discharge patient  Advised to seek care at [facility] for continued chest pain and SOB Per Dr. Katherene Ponto recommendation Patient verbalized an understanding of the plan of care and agrees to seek care at [facility].   Raelyn Mora, CNM 11/10/2017 1:04 AM

## 2017-11-10 NOTE — ED Provider Notes (Signed)
[place] DEPT Provider Note   [name]: [id number] Arrival date & time: 11/10/17  2056     History   Chief Complaint Chief Complaint  Patient presents with  . Abdominal Pain    HPI Monica Nash is a 22 y.o. female history of anemia, UTI who presents for evaluation of 2 weeks of progressively worsening upper abdominal pain.  She reports that over the last 3 days, the pain is significantly worsened.  She states it starts in the right upper quadrant and radiates to the epigastric and left upper quadrant region.  She describes it as a "cramping" type of pain.  She states that when she has the pain, it radiates up her chest but denies any specific chest pain.  She states the pain is worsened with eating.  She states that when she eats, she started to experience any pain in the right upper quadrant and has to induce vomiting to help the pain get better.  She reports no blood in emesis.  [facility] because she thought she was having UTI.  At that time, they advised her to go to the emergency department for further evaluation.  Patient reports that she is having diarrhea.  Her last bowel movement was earlier today was normal.  No presence of blood.  Patient reports no history of abdominal surgeries.  She does report a history of a liver infection from a UTI when she was pregnant but states she has no complications from that.  Patient denies any fevers, chest pain, difficulty breathing, dysuria, hematuria.  The history is provided by the patient.    Past Medical History:  Diagnosis Date  . Anemia   . UTI (urinary tract infection)     Patient Active Problem List   Diagnosis Date Noted  . Choledocholithiasis 11/11/2017  . Elevated liver enzymes 11/11/2017  . Nausea and vomiting 11/11/2017  . Hypokalemia 11/11/2017  . Chest pain 11/10/2017  . SOB (shortness of breath) 11/10/2017  . S/P primary low transverse C-section 01/27/2015     Past Surgical History:  Procedure Laterality Date  . CESAREAN SECTION N/A 01/27/2015   Procedure: CESAREAN SECTION;  Surgeon: Truett Mainland, DO;  Location: [place] [facility];  Service: Obstetrics;  Laterality: N/A;     OB History    Gravida  2   Para  1   Term  1   Preterm      AB  1   Living  1     SAB  1   TAB      Ectopic      Multiple  0   Live Births  1            Home Medications    Prior to Admission medications   Medication Sig Start Date End Date Taking? Authorizing Provider  medroxyPROGESTERone (DEPO-PROVERA) 150 MG/ML injection Inject 150 mg into the muscle every 3 (three) months.   Yes [provider]    Family History History reviewed. No pertinent family history.  Social History Social History   Tobacco Use  . Smoking status: Never Smoker  . Smokeless tobacco: Never Used  Substance Use Topics  . Alcohol use: No  . Drug use: Yes    Types: Marijuana     Allergies   Patient has no known allergies.   Review of Systems Review of Systems  Constitutional: Negative for fever.  Respiratory: Negative for cough and shortness of breath.   Cardiovascular: Negative for  chest pain.  Gastrointestinal: Positive for abdominal pain, nausea and vomiting. Negative for blood in stool and constipation.  Genitourinary: Negative for dysuria and hematuria.  Neurological: Negative for headaches.  All other systems reviewed and are negative.    Physical Exam Updated Vital Signs BP 113/66 (BP Location: Left Arm)   Pulse (!) 47   Temp 99.1 F (37.3 C) (Oral)   Resp 18   Ht 5' 4"  (1.626 m)   Wt 44.5 kg   SpO2 100%   BMI 16.82 kg/m   Physical Exam  Constitutional: She is oriented to person, place, and time. She appears well-developed and well-nourished.  Sitting comfortably on examination table  HENT:  Head: Normocephalic and atraumatic.  Mouth/Throat: Oropharynx is clear and moist and mucous membranes are normal.  Eyes: Pupils are  equal, round, and reactive to light. Conjunctivae, EOM and lids are normal.  Neck: Full passive range of motion without pain.  Cardiovascular: Normal rate, regular rhythm, normal heart sounds and normal pulses. Exam reveals no gallop and no friction rub.  No murmur heard. Pulmonary/Chest: Effort normal and breath sounds normal.  Lungs clear to auscultation bilaterally.  Symmetric chest rise.  No wheezing, rales, rhonchi.  Abdominal: Soft. Normal appearance and bowel sounds are normal. There is tenderness in the right upper quadrant, epigastric area and left upper quadrant. There is positive Murphy's sign. There is no rigidity, no guarding, no CVA tenderness and no tenderness at McBurney's point.  Abdomen soft, nondistended.  Tenderness palpation of the right upper quadrant, epigastric and left upper quadrant.  Positive Murphy sign.  No tenderness noted to McBurney's point.  No CVA tenderness.  Musculoskeletal: Normal range of motion.  Neurological: She is alert and oriented to person, place, and time.  Skin: Skin is warm and dry. Capillary refill takes less than 2 seconds.  Psychiatric: She has a normal mood and affect. Her speech is normal.  Nursing note and vitals reviewed.    ED Treatments / Results  Labs (all labs ordered are listed, but only abnormal results are displayed) Labs Reviewed  COMPREHENSIVE METABOLIC PANEL - Abnormal; Notable for the following components:      Result Value   Potassium 3.3 (*)    Total Protein 8.2 (*)    Albumin 5.1 (*)    AST 223 (*)    ALT 508 (*)    Total Bilirubin 2.5 (*)    All other components within normal limits  URINALYSIS, ROUTINE W REFLEX MICROSCOPIC - Abnormal; Notable for the following components:   Color, Urine AMBER (*)    Hgb urine dipstick SMALL (*)    Bilirubin Urine MODERATE (*)    Protein, ur 100 (*)    Bacteria, UA RARE (*)    All other components within normal limits  CBC - Abnormal; Notable for the following components:   RBC  3.63 (*)    Hemoglobin 10.5 (*)    HCT 31.3 (*)    All other components within normal limits  LIPASE, BLOOD  CBC  PREGNANCY, URINE  HIV ANTIBODY (ROUTINE TESTING)  HEPATITIS PANEL, ACUTE  COMPREHENSIVE METABOLIC PANEL    EKG None  Radiology US Abdomen Limited Ruq  Result Date: 11/11/2017 CLINICAL DATA:  22 y/o F; two weeks of right upper quadrant abdominal pain and transaminitis. EXAM: ULTRASOUND ABDOMEN LIMITED RIGHT UPPER QUADRANT COMPARISON:  None. FINDINGS: Gallbladder: Multiple gallstones measuring up to 1 cm. 4 mm stone in the gallbladder neck. No gallbladder wall thickening or pericholecystic fluid. Negative sonographic Murphy's  sign. Common bile duct: Diameter: 3.5 mm Liver: No focal lesion identified. Within normal limits in parenchymal echogenicity. Intrahepatic biliary ductal dilatation. Portal vein is patent on color Doppler imaging with normal direction of blood flow towards the liver. IMPRESSION: 1. Intrahepatic biliary ductal dilatation, no extrahepatic biliary ductal dilatation, 4 mm stone in the cystic duct, question Mirizzi syndrome. Consider further characterization with MRI/MRCP. 2. Cholelithiasis.  No secondary findings of acute cholecystitis. Electronically Signed   By: Kristine Garbe M.D.   On: 11/11/2017 02:39    Procedures Procedures (including critical care time)  Medications Ordered in ED Medications  enoxaparin (LOVENOX) injection 30 mg (has no administration in time range)  potassium chloride 10 mEq in 100 mL IVPB (10 mEq Intravenous New Bag/Given 11/11/17 0503)  0.9 %  sodium chloride infusion ( Intravenous New Bag/Given 11/11/17 0458)  acetaminophen (TYLENOL) tablet 650 mg (has no administration in time range)    Or  acetaminophen (TYLENOL) suppository 650 mg (has no administration in time range)  ondansetron (ZOFRAN) tablet 4 mg (has no administration in time range)    Or  ondansetron (ZOFRAN) injection 4 mg (has no administration in time  range)  ketorolac (TORADOL) 30 MG/ML injection 30 mg (has no administration in time range)  albuterol (PROVENTIL) (2.5 MG/3ML) 0.083% nebulizer solution 2.5 mg (has no administration in time range)  0.9 %  sodium chloride infusion (250 mLs Intravenous New Bag/Given 11/11/17 0502)  sodium chloride 0.9 % bolus 1,000 mL (0 mLs Intravenous Stopped 11/11/17 0110)  ketorolac (TORADOL) 30 MG/ML injection 30 mg (30 mg Intravenous Given 11/11/17 0009)  fentaNYL (SUBLIMAZE) injection 50 mcg (50 mcg Intravenous Given 11/11/17 0227)     Initial Impression / Assessment and Plan / ED Course  I have reviewed the triage vital signs and the nursing notes.  Pertinent labs & imaging results that were available during my care of the patient were reviewed by me and considered in my medical decision making (see chart for details).     22 year old female who presents for evaluation of upper abdominal pain x2 weeks.  Reports worsening last 3 days.  That was cramps [facility].  Urine was unremarkable and she was told to follow-up with the ED for further evaluation.  No fevers, urinary complaints. Patient is afebrile, non-toxic appearing, sitting comfortably on examination table. Vital signs reviewed and stable.  On exam, patient with tenderness noted to right upper quadrant, epigastric and left upper quadrant abdomen.  Consider viral GI process versus hepatobiliary etiology versus pancreatitis.  History/physical exam is not concerning for appendicitis, diverticulitis, perforation, ovarian torsion.  Initial labs ordered at triage.  Urine pregnancy negative.  UA shows small hemoglobin, moderate bilirubin, small amount of protein.  No leukocytes, nitrates.  CMP shows potassium of 3.3.  AST is 223, ALT is 508 and total bili is 2.5.  Alk phos is 125.  Her most recent CMP was 9 months ago which showed normal LFTs at that time.  Lipase is unremarkable.  CBC without any significant leukocytosis or  anemia.  Given elevation in AST/ALT and total bili, concern for gallbladder etiology.  We will plan for right upper quadrant ultrasound to evaluate for cholecystitis.  U/S shows intrahepatic biliary ductal dilatation. 4 mm stone noted in the cystic duct, question Mirizzi syndrome. CBD is 3.5 mm.   Discussed patient with Dr. Harlow Asa (Gen Surg). Recommends discussing with GI regarding elevated LFTs and possible need for ERCP/MRCP. Requests that GI or Medicine admit for LFT  elevation work up and to make sure she does not have a blockage in the CBD. Will be happy to consult with plans for cholecystectomy.   Discussed patient with Dr. Tamala Julian (Hospitalist). Will admit.   Discussed patient with Dr. Hilarie Fredrickson (GI). Will plan to consult.   Final Clinical Impressions(s) / ED Diagnoses   Final diagnoses:  RUQ abdominal pain  Transaminitis    ED Discharge Orders    None       Volanda Napoleon, PA-C 11/11/17 0540    Veryl Speak, MD 11/11/17 [phone]

## 2017-11-10 NOTE — ED Triage Notes (Signed)
Pt reports "pressure and cramping" in the upper abdomen that radiates to the mid back, associated with n/v. Pain worse after eating. Coming and going for 2 weeks.

## 2017-11-11 ENCOUNTER — Encounter (HOSPITAL_COMMUNITY): Payer: Self-pay | Admitting: Internal Medicine

## 2017-11-11 ENCOUNTER — Inpatient Hospital Stay (HOSPITAL_COMMUNITY): Payer: Medicaid Other | Admitting: Certified Registered Nurse Anesthetist

## 2017-11-11 ENCOUNTER — Emergency Department (HOSPITAL_COMMUNITY): Payer: Medicaid Other

## 2017-11-11 ENCOUNTER — Inpatient Hospital Stay (HOSPITAL_COMMUNITY): Payer: Medicaid Other

## 2017-11-11 ENCOUNTER — Encounter (HOSPITAL_COMMUNITY): Admission: EM | Disposition: A | Discharge status: Home / Self Care | Payer: Self-pay | Source: Home / Self Care

## 2017-11-11 DIAGNOSIS — R740 Nonspecific elevation of levels of transaminase and lactic acid dehydrogenase [LDH]: Secondary | ICD-10-CM | POA: Diagnosis not present

## 2017-11-11 DIAGNOSIS — R079 Chest pain, unspecified: Secondary | ICD-10-CM | POA: Diagnosis not present

## 2017-11-11 DIAGNOSIS — K8050 Calculus of bile duct without cholangitis or cholecystitis without obstruction: Secondary | ICD-10-CM | POA: Diagnosis present

## 2017-11-11 DIAGNOSIS — R1011 Right upper quadrant pain: Secondary | ICD-10-CM

## 2017-11-11 DIAGNOSIS — R112 Nausea with vomiting, unspecified: Secondary | ICD-10-CM | POA: Diagnosis present

## 2017-11-11 DIAGNOSIS — K8020 Calculus of gallbladder without cholecystitis without obstruction: Secondary | ICD-10-CM | POA: Diagnosis not present

## 2017-11-11 DIAGNOSIS — E876 Hypokalemia: Secondary | ICD-10-CM | POA: Diagnosis not present

## 2017-11-11 DIAGNOSIS — K915 Postcholecystectomy syndrome: Secondary | ICD-10-CM | POA: Diagnosis not present

## 2017-11-11 DIAGNOSIS — R109 Unspecified abdominal pain: Secondary | ICD-10-CM | POA: Diagnosis not present

## 2017-11-11 DIAGNOSIS — Z793 Long term (current) use of hormonal contraceptives: Secondary | ICD-10-CM | POA: Diagnosis not present

## 2017-11-11 DIAGNOSIS — K8064 Calculus of gallbladder and bile duct with chronic cholecystitis without obstruction: Secondary | ICD-10-CM | POA: Diagnosis not present

## 2017-11-11 DIAGNOSIS — R748 Abnormal levels of other serum enzymes: Secondary | ICD-10-CM | POA: Diagnosis not present

## 2017-11-11 DIAGNOSIS — K8010 Calculus of gallbladder with chronic cholecystitis without obstruction: Secondary | ICD-10-CM | POA: Diagnosis not present

## 2017-11-11 DIAGNOSIS — D649 Anemia, unspecified: Secondary | ICD-10-CM | POA: Diagnosis not present

## 2017-11-11 HISTORY — PX: CHOLECYSTECTOMY: SHX55

## 2017-11-11 LAB — COMPREHENSIVE METABOLIC PANEL
ALK PHOS: 108 U/L (ref 38–126)
ALT: 430 U/L — ABNORMAL HIGH (ref 0–44)
ANION GAP: 9 (ref 5–15)
AST: 194 U/L — ABNORMAL HIGH (ref 15–41)
Albumin: 4.1 g/dL (ref 3.5–5.0)
BILIRUBIN TOTAL: 2 mg/dL — AB (ref 0.3–1.2)
BUN: 10 mg/dL (ref 6–20)
CALCIUM: 9.1 mg/dL (ref 8.9–10.3)
CO2: 24 mmol/L (ref 22–32)
Chloride: 110 mmol/L (ref 98–111)
Creatinine, Ser: 0.66 mg/dL (ref 0.44–1.00)
GLUCOSE: 97 mg/dL (ref 70–99)
POTASSIUM: 3.6 mmol/L (ref 3.5–5.1)
Sodium: 143 mmol/L (ref 135–145)
TOTAL PROTEIN: 6.5 g/dL (ref 6.5–8.1)

## 2017-11-11 LAB — CBC
HCT: 31.3 % — ABNORMAL LOW (ref 36.0–46.0)
HEMOGLOBIN: 10.5 g/dL — AB (ref 12.0–15.0)
MCH: 28.9 pg (ref 26.0–34.0)
MCHC: 33.5 g/dL (ref 30.0–36.0)
MCV: 86.2 fL (ref 78.0–100.0)
Platelets: 188 10*3/uL (ref 150–400)
RBC: 3.63 MIL/uL — AB (ref 3.87–5.11)
RDW: 14.6 % (ref 11.5–15.5)
WBC: 4.9 10*3/uL (ref 4.0–10.5)

## 2017-11-11 LAB — MRSA PCR SCREENING: MRSA BY PCR: NEGATIVE

## 2017-11-11 LAB — HIV ANTIBODY (ROUTINE TESTING W REFLEX): HIV Screen 4th Generation wRfx: NONREACTIVE

## 2017-11-11 SURGERY — LAPAROSCOPIC CHOLECYSTECTOMY WITH INTRAOPERATIVE CHOLANGIOGRAM
Anesthesia: General | Site: Abdomen

## 2017-11-11 MED ORDER — LIDOCAINE 2% (20 MG/ML) 5 ML SYRINGE
INTRAMUSCULAR | Status: DC | PRN
  Administered 2017-11-11: 60 mg via INTRAVENOUS

## 2017-11-11 MED ORDER — FENTANYL CITRATE (PF) 100 MCG/2ML IJ SOLN
INTRAMUSCULAR | Status: DC | PRN
  Administered 2017-11-11 (×2): 100 ug via INTRAVENOUS

## 2017-11-11 MED ORDER — KETOROLAC TROMETHAMINE 30 MG/ML IJ SOLN
30.0000 mg | Freq: Three times a day (TID) | INTRAMUSCULAR | Status: DC
  Administered 2017-11-11: 30 mg via INTRAVENOUS
  Filled 2017-11-11 (×2): qty 1

## 2017-11-11 MED ORDER — ENOXAPARIN SODIUM 30 MG/0.3ML ~~LOC~~ SOLN
30.0000 mg | SUBCUTANEOUS | Status: DC
  Administered 2017-11-12: 30 mg via SUBCUTANEOUS
  Filled 2017-11-11: qty 0.3

## 2017-11-11 MED ORDER — SODIUM CHLORIDE 0.9 % IV SOLN
INTRAVENOUS | Status: DC
  Administered 2017-11-11: 05:00:00 via INTRAVENOUS

## 2017-11-11 MED ORDER — PROPOFOL 10 MG/ML IV BOLUS
INTRAVENOUS | Status: AC
  Filled 2017-11-11: qty 20

## 2017-11-11 MED ORDER — ROCURONIUM BROMIDE 10 MG/ML (PF) SYRINGE
PREFILLED_SYRINGE | INTRAVENOUS | Status: DC | PRN
  Administered 2017-11-11: 40 mg via INTRAVENOUS

## 2017-11-11 MED ORDER — FENTANYL CITRATE (PF) 100 MCG/2ML IJ SOLN
50.0000 ug | Freq: Once | INTRAMUSCULAR | Status: AC
  Administered 2017-11-11: 50 ug via INTRAVENOUS
  Filled 2017-11-11: qty 2

## 2017-11-11 MED ORDER — POTASSIUM CHLORIDE 10 MEQ/100ML IV SOLN
10.0000 meq | INTRAVENOUS | Status: DC
  Administered 2017-11-11 (×2): 10 meq via INTRAVENOUS
  Filled 2017-11-11: qty 100

## 2017-11-11 MED ORDER — DEXAMETHASONE SODIUM PHOSPHATE 4 MG/ML IJ SOLN
INTRAMUSCULAR | Status: DC | PRN
  Administered 2017-11-11: 10 mg via INTRAVENOUS

## 2017-11-11 MED ORDER — ACETAMINOPHEN 650 MG RE SUPP: 650.0000 mg | Freq: Four times a day (QID) | RECTAL | Status: DC | PRN

## 2017-11-11 MED ORDER — LACTATED RINGERS IR SOLN
Status: DC | PRN
  Administered 2017-11-11: 1000 mL

## 2017-11-11 MED ORDER — ACETAMINOPHEN 325 MG PO TABS: 650.0000 mg | ORAL_TABLET | Freq: Four times a day (QID) | ORAL | Status: DC | PRN

## 2017-11-11 MED ORDER — 0.9 % SODIUM CHLORIDE (POUR BTL) OPTIME
TOPICAL | Status: DC | PRN
  Administered 2017-11-11: 1000 mL

## 2017-11-11 MED ORDER — BUPIVACAINE-EPINEPHRINE (PF) 0.5% -1:200000 IJ SOLN
INTRAMUSCULAR | Status: DC | PRN
  Administered 2017-11-11: 10 mL via PERINEURAL

## 2017-11-11 MED ORDER — SUGAMMADEX SODIUM 200 MG/2ML IV SOLN
INTRAVENOUS | Status: AC
  Filled 2017-11-11: qty 2

## 2017-11-11 MED ORDER — DEXAMETHASONE SODIUM PHOSPHATE 10 MG/ML IJ SOLN
INTRAMUSCULAR | Status: AC
  Filled 2017-11-11: qty 1

## 2017-11-11 MED ORDER — BUPIVACAINE-EPINEPHRINE (PF) 0.5% -1:200000 IJ SOLN
INTRAMUSCULAR | Status: AC
  Filled 2017-11-11: qty 30

## 2017-11-11 MED ORDER — LACTATED RINGERS IV SOLN: INTRAVENOUS | Status: DC

## 2017-11-11 MED ORDER — ONDANSETRON HCL 4 MG PO TABS: 4.0000 mg | ORAL_TABLET | Freq: Four times a day (QID) | ORAL | Status: DC | PRN

## 2017-11-11 MED ORDER — IOPAMIDOL (ISOVUE-300) INJECTION 61%
INTRAVENOUS | Status: AC
  Filled 2017-11-11: qty 50

## 2017-11-11 MED ORDER — MIDAZOLAM HCL 5 MG/5ML IJ SOLN
INTRAMUSCULAR | Status: DC | PRN
  Administered 2017-11-11: 2 mg via INTRAVENOUS

## 2017-11-11 MED ORDER — PROPOFOL 10 MG/ML IV BOLUS
INTRAVENOUS | Status: DC | PRN
  Administered 2017-11-11: 50 mg via INTRAVENOUS
  Administered 2017-11-11: 100 mg via INTRAVENOUS
  Administered 2017-11-11: 150 mg via INTRAVENOUS

## 2017-11-11 MED ORDER — FENTANYL CITRATE (PF) 100 MCG/2ML IJ SOLN
INTRAMUSCULAR | Status: AC
  Filled 2017-11-11: qty 2

## 2017-11-11 MED ORDER — SUGAMMADEX SODIUM 200 MG/2ML IV SOLN
INTRAVENOUS | Status: DC | PRN
  Administered 2017-11-11: 100 mg via INTRAVENOUS

## 2017-11-11 MED ORDER — LACTATED RINGERS IV SOLN
INTRAVENOUS | Status: DC
  Administered 2017-11-11 (×2): via INTRAVENOUS

## 2017-11-11 MED ORDER — MIDAZOLAM HCL 2 MG/2ML IJ SOLN
INTRAMUSCULAR | Status: AC
  Filled 2017-11-11: qty 2

## 2017-11-11 MED ORDER — KETOROLAC TROMETHAMINE 30 MG/ML IJ SOLN: 30.0000 mg | Freq: Four times a day (QID) | INTRAMUSCULAR | Status: DC | PRN

## 2017-11-11 MED ORDER — ENOXAPARIN SODIUM 30 MG/0.3ML ~~LOC~~ SOLN: 30.0000 mg | SUBCUTANEOUS | Status: DC

## 2017-11-11 MED ORDER — HYDROMORPHONE HCL 2 MG/ML IJ SOLN
INTRAMUSCULAR | Status: AC
  Filled 2017-11-11: qty 1

## 2017-11-11 MED ORDER — LIDOCAINE 2% (20 MG/ML) 5 ML SYRINGE
INTRAMUSCULAR | Status: AC
  Filled 2017-11-11: qty 5

## 2017-11-11 MED ORDER — ESMOLOL HCL 100 MG/10ML IV SOLN
INTRAVENOUS | Status: AC
  Filled 2017-11-11: qty 10

## 2017-11-11 MED ORDER — METOCLOPRAMIDE HCL 5 MG/ML IJ SOLN: 10.0000 mg | Freq: Once | INTRAMUSCULAR | Status: DC | PRN

## 2017-11-11 MED ORDER — HYDROMORPHONE HCL 1 MG/ML IJ SOLN
INTRAMUSCULAR | Status: DC | PRN
  Administered 2017-11-11 (×2): 0.5 mg via INTRAVENOUS

## 2017-11-11 MED ORDER — OXYCODONE HCL 5 MG PO TABS: 5.0000 mg | ORAL_TABLET | ORAL | Status: DC | PRN

## 2017-11-11 MED ORDER — SODIUM CHLORIDE 0.9 % IV SOLN
2.0000 g | INTRAVENOUS | Status: AC
  Administered 2017-11-11: 2 g via INTRAVENOUS
  Filled 2017-11-11: qty 20

## 2017-11-11 MED ORDER — MEPERIDINE HCL 50 MG/ML IJ SOLN: 6.2500 mg | INTRAMUSCULAR | Status: DC | PRN

## 2017-11-11 MED ORDER — ONDANSETRON HCL 4 MG/2ML IJ SOLN
4.0000 mg | Freq: Four times a day (QID) | INTRAMUSCULAR | Status: DC | PRN
  Administered 2017-11-11: 4 mg via INTRAVENOUS

## 2017-11-11 MED ORDER — ROCURONIUM BROMIDE 10 MG/ML (PF) SYRINGE
PREFILLED_SYRINGE | INTRAVENOUS | Status: AC
  Filled 2017-11-11: qty 10

## 2017-11-11 MED ORDER — ACETAMINOPHEN 500 MG PO TABS
1000.0000 mg | ORAL_TABLET | Freq: Four times a day (QID) | ORAL | Status: DC
  Administered 2017-11-11 – 2017-11-12 (×3): 1000 mg via ORAL
  Filled 2017-11-11 (×3): qty 2

## 2017-11-11 MED ORDER — SODIUM CHLORIDE 0.9 % IV SOLN: INTRAVENOUS | Status: DC

## 2017-11-11 MED ORDER — MORPHINE SULFATE (PF) 4 MG/ML IV SOLN: 1.0000 mg | INTRAVENOUS | Status: DC | PRN

## 2017-11-11 MED ORDER — FENTANYL CITRATE (PF) 100 MCG/2ML IJ SOLN: 25.0000 ug | INTRAMUSCULAR | Status: DC | PRN

## 2017-11-11 MED ORDER — SODIUM CHLORIDE 0.9 % IV SOLN
INTRAVENOUS | Status: DC | PRN
  Administered 2017-11-11: 250 mL via INTRAVENOUS

## 2017-11-11 MED ORDER — ONDANSETRON HCL 4 MG/2ML IJ SOLN
INTRAMUSCULAR | Status: AC
  Filled 2017-11-11: qty 2

## 2017-11-11 MED ORDER — IOPAMIDOL (ISOVUE-300) INJECTION 61%
INTRAVENOUS | Status: DC | PRN
  Administered 2017-11-11: 4.5 mL via INTRAVENOUS

## 2017-11-11 MED ORDER — ALBUTEROL SULFATE (2.5 MG/3ML) 0.083% IN NEBU: 2.5000 mg | INHALATION_SOLUTION | Freq: Four times a day (QID) | RESPIRATORY_TRACT | Status: DC | PRN

## 2017-11-11 MED ORDER — ESMOLOL HCL 100 MG/10ML IV SOLN
INTRAVENOUS | Status: DC | PRN
  Administered 2017-11-11: 20 mg via INTRAVENOUS

## 2017-11-11 SURGICAL SUPPLY — 35 items
APPLIER CLIP 5 13 M/L LIGAMAX5 (MISCELLANEOUS) ×3
CABLE HIGH FREQUENCY MONO STRZ (ELECTRODE) ×3 IMPLANT
CHLORAPREP W/TINT 26ML (MISCELLANEOUS) ×3 IMPLANT
CLIP APPLIE 5 13 M/L LIGAMAX5 (MISCELLANEOUS) ×1 IMPLANT
COVER MAYO STAND STRL (DRAPES) ×3 IMPLANT
DERMABOND ADVANCED (GAUZE/BANDAGES/DRESSINGS) ×2
DERMABOND ADVANCED .7 DNX12 (GAUZE/BANDAGES/DRESSINGS) ×1 IMPLANT
DRAPE C-ARM 42X120 X-RAY (DRAPES) ×3 IMPLANT
ELECT REM PT RETURN 15FT ADLT (MISCELLANEOUS) ×3 IMPLANT
ENDOLOOP SUT PDS II  0 18 (SUTURE) ×2
ENDOLOOP SUT PDS II 0 18 (SUTURE) ×1 IMPLANT
GLOVE BIO SURGEON STRL SZ 6.5 (GLOVE) ×2 IMPLANT
GLOVE BIO SURGEONS STRL SZ 6.5 (GLOVE) ×1
GLOVE BIOGEL PI IND STRL 7.0 (GLOVE) ×1 IMPLANT
GLOVE BIOGEL PI INDICATOR 7.0 (GLOVE) ×2
GOWN STRL REUS W/TWL 2XL LVL3 (GOWN DISPOSABLE) ×3 IMPLANT
GOWN STRL REUS W/TWL XL LVL3 (GOWN DISPOSABLE) ×6 IMPLANT
IRRIG SUCT STRYKERFLOW 2 WTIP (MISCELLANEOUS) ×3
IRRIGATION SUCT STRKRFLW 2 WTP (MISCELLANEOUS) ×1 IMPLANT
IV CATH 14GX2 1/4 (CATHETERS) ×3 IMPLANT
KIT BASIN OR (CUSTOM PROCEDURE TRAY) ×3 IMPLANT
POUCH SPECIMEN RETRIEVAL 10MM (ENDOMECHANICALS) ×3 IMPLANT
SCISSORS LAP 5X35 DISP (ENDOMECHANICALS) ×3 IMPLANT
SET CHOLANGIOGRAPH MIX (MISCELLANEOUS) ×3 IMPLANT
SLEEVE XCEL OPT CAN 5 100 (ENDOMECHANICALS) ×6 IMPLANT
SUT MNCRL AB 4-0 PS2 18 (SUTURE) ×3 IMPLANT
SUT VIC AB 2-0 SH 27 (SUTURE) ×2
SUT VIC AB 2-0 SH 27X BRD (SUTURE) ×1 IMPLANT
SUT VIC AB 4-0 PS2 18 (SUTURE) ×3 IMPLANT
TOWEL OR 17X26 10 PK STRL BLUE (TOWEL DISPOSABLE) ×3 IMPLANT
TOWEL OR NON WOVEN STRL DISP B (DISPOSABLE) ×3 IMPLANT
TRAY LAPAROSCOPIC (CUSTOM PROCEDURE TRAY) ×3 IMPLANT
TROCAR BLADELESS OPT 5 100 (ENDOMECHANICALS) ×3 IMPLANT
TROCAR XCEL BLUNT TIP 100MML (ENDOMECHANICALS) ×3 IMPLANT
TUBING INSUF HEATED (TUBING) ×3 IMPLANT

## 2017-11-11 NOTE — Anesthesia Preprocedure Evaluation (Signed)
Anesthesia Evaluation  Patient identified by MRN, date of birth, ID band Patient awake    Reviewed: Allergy & Precautions, NPO status , Patient's Chart, lab work & pertinent test results  Airway Mallampati: II  TM Distance: >3 FB Neck ROM: Full    Dental no notable dental hx.    Pulmonary neg pulmonary ROS,    Pulmonary exam normal breath sounds clear to auscultation       Cardiovascular negative cardio ROS Normal cardiovascular exam Rhythm:Regular Rate:Normal     Neuro/Psych negative neurological ROS  negative psych ROS   GI/Hepatic negative GI ROS, Neg liver ROS,   Endo/Other  negative endocrine ROS  Renal/GU negative Renal ROS  negative genitourinary   Musculoskeletal negative musculoskeletal ROS (+)   Abdominal   Peds negative pediatric ROS (+)  Hematology negative hematology ROS (+)   Anesthesia Other Findings   Reproductive/Obstetrics negative OB ROS                             Anesthesia Physical Anesthesia Plan  ASA: I  Anesthesia Plan: General   Post-op Pain Management:    Induction: Intravenous  PONV Risk Score and Plan: 4 or greater and Ondansetron, Dexamethasone, Midazolam, Scopolamine patch - Pre-op and Treatment may vary due to age or medical condition  Airway Management Planned: Oral ETT  Additional Equipment:   Intra-op Plan:   Post-operative Plan: Extubation in OR  Informed Consent: I have reviewed the patients History and Physical, chart, labs and discussed the procedure including the risks, benefits and alternatives for the proposed anesthesia with the patient or authorized representative who has indicated his/her understanding and acceptance.   Dental advisory given  Plan Discussed with: CRNA  Anesthesia Plan Comments:         Anesthesia Quick Evaluation  

## 2017-11-11 NOTE — Anesthesia Postprocedure Evaluation (Signed)
Anesthesia Post Note  Patient: Building control surveyor  Procedure(s) Performed: LAPAROSCOPIC CHOLECYSTECTOMY WITH INTRAOPERATIVE CHOLANGIOGRAM (N/A Abdomen)     Patient location during evaluation: PACU Anesthesia Type: General Level of consciousness: awake and alert Pain management: pain level controlled Vital Signs Assessment: post-procedure vital signs reviewed and stable Respiratory status: spontaneous breathing, nonlabored ventilation, respiratory function stable and patient connected to nasal cannula oxygen Cardiovascular status: blood pressure returned to baseline and stable Postop Assessment: no apparent nausea or vomiting Anesthetic complications: no    Last Vitals:  Vitals:   11/11/17 1200 11/11/17 1215  BP: 115/74 109/80  Pulse: 64 (!) 55  Resp: 18 18  Temp:    SpO2: 100% 100%    Last Pain:  Vitals:   11/11/17 1215  TempSrc:   PainSc: (P) 0-No pain                 Phillips Grout

## 2017-11-11 NOTE — ED Notes (Signed)
Dr.Smith in to evaluate pt at this time. Pt will be transferred to floor once exam is complete by Dr.Smith

## 2017-11-11 NOTE — Discharge Instructions (Signed)

## 2017-11-11 NOTE — ED Notes (Signed)
ED TO INPATIENT HANDOFF REPORT  Name/Age/Gender Monica Nash 22 y.o. female  Code Status    Code Status Orders  (From admission, onward)         Start     Ordered   11/11/17 0341  Full code  Continuous     11/11/17 0344        Code Status History    Date Active Date Inactive Code Status Order ID Comments User Context   01/27/2015 1357 01/29/2015 1758 Full Code [id number]  Caren Macadam, MD Inpatient   01/23/2015 0748 01/23/2015 1340 Full Code [id number]  Osborne Oman, MD Inpatient      Home/SNF/Other Home  Chief Complaint ABD pain   Level of Care/Admitting Diagnosis ED Disposition    ED Disposition Condition [place]: [facility] [[id number]]  Level of Care: Med-Surg [16]  Diagnosis: Choledocholithiasis [[id number]]  Admitting Physician: Norval Morton [[id number]]  Attending Physician: Norval Morton [[id number]]  Estimated length of stay: past midnight tomorrow  Certification:: I certify this patient will need inpatient services for at least 2 midnights  PT Class (Do Not Modify): Inpatient [101]  PT Acc Code (Do Not Modify): Private [1]       Medical History Past Medical History:  Diagnosis Date  . Anemia   . UTI (urinary tract infection)     Allergies No Known Allergies  IV Location/Drains/Wounds Patient Lines/Drains/Airways Status   Active Line/Drains/Airways    Name:   Placement date:   Placement time:   Site:   Days:   Peripheral IV 11/11/17 Right Forearm   11/11/17    0007    Forearm   less than 1   Incision (Closed) 01/27/15 Abdomen Other (Comment)   01/27/15    1033     1019          Labs/Imaging Results for orders placed or performed during the hospital encounter of 11/10/17 (from the past 48 hour(s))  Lipase, blood     Status: None   Collection Time: 11/10/17  9:52 PM  Result Value Ref Range   Lipase 26 11 - 51 U/L    Comment: Performed at [facility], [place]  [address], [place], [place] [address]  Comprehensive metabolic panel     Status: Abnormal   Collection Time: 11/10/17  9:52 PM  Result Value Ref Range   Sodium 142 135 - 145 mmol/L   Potassium 3.3 (L) 3.5 - 5.1 mmol/L   Chloride 103 98 - 111 mmol/L   CO2 28 22 - 32 mmol/L   Glucose, Bld 98 70 - 99 mg/dL   BUN 10 6 - 20 mg/dL   Creatinine, Ser 0.68 0.44 - 1.00 mg/dL   Calcium 10.0 8.9 - 10.3 mg/dL   Total Protein 8.2 (H) 6.5 - 8.1 g/dL   Albumin 5.1 (H) 3.5 - 5.0 g/dL   AST 223 (H) 15 - 41 U/L   ALT 508 (H) 0 - 44 U/L   Alkaline Phosphatase 125 38 - 126 U/L   Total Bilirubin 2.5 (H) 0.3 - 1.2 mg/dL   GFR calc non Af Amer >60 >60 mL/min   GFR calc Af Amer >60 >60 mL/min    Comment: (NOTE) The eGFR has been calculated using the CKD EPI equation. This calculation has not been validated in all clinical situations. eGFR's persistently <60 mL/min signify possible Chronic Kidney Disease.    Anion gap 11 5 - 15    Comment:  Performed at [facility], [place] [address], [place], [place] [address]  CBC     Status: None   Collection Time: 11/10/17  9:52 PM  Result Value Ref Range   WBC 6.0 4.0 - 10.5 K/uL   RBC 4.23 3.87 - 5.11 MIL/uL   Hemoglobin 12.1 12.0 - 15.0 g/dL   HCT 36.6 36.0 - 46.0 %   MCV 86.5 78.0 - 100.0 fL   MCH 28.6 26.0 - 34.0 pg   MCHC 33.1 30.0 - 36.0 g/dL   RDW 14.5 11.5 - 15.5 %   Platelets 244 150 - 400 K/uL    Comment: Performed at [facility], [place] [address], [place], [place] [address]  Urinalysis, Routine w reflex microscopic     Status: Abnormal   Collection Time: 11/10/17  9:59 PM  Result Value Ref Range   Color, Urine AMBER (A) YELLOW    Comment: BIOCHEMICALS MAY BE AFFECTED BY COLOR   APPearance CLEAR CLEAR   Specific Gravity, Urine 1.030 1.005 - 1.030   pH 6.0 5.0 - 8.0   Glucose, UA NEGATIVE NEGATIVE mg/dL   Hgb urine dipstick SMALL (A) NEGATIVE   Bilirubin Urine MODERATE (A) NEGATIVE   Ketones, ur NEGATIVE  NEGATIVE mg/dL   Protein, ur 100 (A) NEGATIVE mg/dL   Nitrite NEGATIVE NEGATIVE   Leukocytes, UA NEGATIVE NEGATIVE   RBC / HPF 21-50 0 - 5 RBC/hpf   WBC, UA 6-10 0 - 5 WBC/hpf   Bacteria, UA RARE (A) NONE SEEN   Squamous Epithelial / LPF 0-5 0 - 5   Mucus PRESENT     Comment: Performed at [facility], [place] [address] [place], [place] [address]  Pregnancy, urine     Status: None   Collection Time: 11/10/17  9:59 PM  Result Value Ref Range   Preg Test, Ur NEGATIVE NEGATIVE    Comment:        THE SENSITIVITY OF THIS METHODOLOGY IS >20 mIU/mL. Performed at [facility], [place] [address], [place], [place] [address]    US Abdomen Limited Ruq  Result Date: 11/11/2017 CLINICAL DATA:  22 y/o F; two weeks of right upper quadrant abdominal pain and transaminitis. EXAM: ULTRASOUND ABDOMEN LIMITED RIGHT UPPER QUADRANT COMPARISON:  None. FINDINGS: Gallbladder: Multiple gallstones measuring up to 1 cm. 4 mm stone in the gallbladder neck. No gallbladder wall thickening or pericholecystic fluid. Negative sonographic Murphy's sign. Common bile duct: Diameter: 3.5 mm Liver: No focal lesion identified. Within normal limits in parenchymal echogenicity. Intrahepatic biliary ductal dilatation. Portal vein is patent on color Doppler imaging with normal direction of blood flow towards the liver. IMPRESSION: 1. Intrahepatic biliary ductal dilatation, no extrahepatic biliary ductal dilatation, 4 mm stone in the cystic duct, question Mirizzi syndrome. Consider further characterization with MRI/MRCP. 2. Cholelithiasis.  No secondary findings of acute cholecystitis. Electronically Signed   By: Kristine Garbe M.D.   On: 11/11/2017 02:39    Pending Labs Unresulted Labs (From admission, onward)    Start     Ordered   11/11/17 0500  Comprehensive metabolic panel  Tomorrow morning,   R     11/11/17 0344   11/11/17 0500  CBC  Tomorrow morning,   R     11/11/17 0344    11/11/17 0341  HIV antibody (Routine Testing)  Once,   R     11/11/17 0344   11/11/17 0341  Hepatitis panel, acute  Add-on,   R  11/11/17 0344          Vitals/Pain Today's Vitals   11/10/17 2116 11/10/17 2123 11/11/17 0004 11/11/17 0225  BP: (!) 100/59  114/76 (!) 115/54  Pulse: 72  (!) 59 85  Resp: 16  18 14   Temp: 98.6 F (37 C)     TempSrc: Oral     SpO2: 100%  95% 98%  Weight: 44.5 kg     Height: 5' 4"  (1.626 m)     PainSc:  6  10-Worst pain ever 2     Isolation Precautions No active isolations  Medications Medications  enoxaparin (LOVENOX) injection 40 mg (has no administration in time range)  potassium chloride 10 mEq in 100 mL IVPB (has no administration in time range)  0.9 %  sodium chloride infusion (has no administration in time range)  acetaminophen (TYLENOL) tablet 650 mg (has no administration in time range)    Or  acetaminophen (TYLENOL) suppository 650 mg (has no administration in time range)  ondansetron (ZOFRAN) tablet 4 mg (has no administration in time range)    Or  ondansetron (ZOFRAN) injection 4 mg (has no administration in time range)  ketorolac (TORADOL) 30 MG/ML injection 30 mg (has no administration in time range)  albuterol (PROVENTIL) (2.5 MG/3ML) 0.083% nebulizer solution 2.5 mg (has no administration in time range)  sodium chloride 0.9 % bolus 1,000 mL (0 mLs Intravenous Stopped 11/11/17 0110)  ketorolac (TORADOL) 30 MG/ML injection 30 mg (30 mg Intravenous Given 11/11/17 0009)  fentaNYL (SUBLIMAZE) injection 50 mcg (50 mcg Intravenous Given 11/11/17 0227)    Mobility walks

## 2017-11-11 NOTE — Op Note (Signed)
11/10/2017 - 11/11/2017  11:24 AM  PATIENT:  Monica Nash  22 y.o. female  Patient Care Team: Patient, No Pcp Per as PCP - General (General Practice) Patient, No Pcp Per (General Practice)  PRE-OPERATIVE DIAGNOSIS:  CHOLEOLITHIASIS  POST-OPERATIVE DIAGNOSIS:  CHOLEOLITHIASIS  PROCEDURE:  LAPAROSCOPIC CHOLECYSTECTOMY WITH INTRAOPERATIVE CHOLANGIOGRAM    Surgeon(s): Romie Levee, MD  ASSISTANT: Barnetta Chapel, PA   ANESTHESIA:   local and general  EBL: 65ml  Total I/O In: 1220 [I.V.:1120; IV Piggyback:100] Out: 20 [Blood:20]  DRAINS: none   SPECIMEN:  Source of Specimen:  galbladder  DISPOSITION OF SPECIMEN:  PATHOLOGY  COUNTS:  YES  PLAN OF CARE: Pt already admitted  PATIENT DISPOSITION:  PACU - hemodynamically stable.  INDICATION: 22 y.o. F with RUQ pain and elevated LFT's  The anatomy & physiology of hepatobiliary & pancreatic function was discussed.  The pathophysiology of gallbladder dysfunction was discussed.  Natural history risks without surgery was discussed.   I feel the risks of no intervention will lead to serious problems that outweigh the operative risks; therefore, I recommended cholecystectomy to remove the pathology.  I explained laparoscopic techniques with possible need for an open approach.  Probable cholangiogram to evaluate the bilary tract was explained as well.    Risks such as bleeding, infection, abscess, leak, injury to other organs, need for further treatment, heart attack, death, and other risks were discussed.  I noted a good likelihood this will help address the problem.  Possibility that this will not correct all abdominal symptoms was explained.  Goals of post-operative recovery were discussed as well.    OR FINDINGS: minimally inflamed gallbladder.  Short cystic duct  DESCRIPTION:   The patient was identified & brought into the operating room. The patient was positioned supine with arms tucked. SCDs were active during the entire case.  The patient underwent general anesthesia without any difficulty.  The abdomen was prepped and draped in a sterile fashion. A Surgical Timeout was performed and confirmed our plan.  We positioned the patient in reverse [place] & right side up.  I placed a Hassan laparoscopic port through the umbilicus using open entry technique.  Entry was clean. There were no adhesions to the anterior abdominal wall supraumbilically.  We induced carbon dioxide insufflation. Camera inspection revealed no injury.    I proceeded to continue with laparoscopic technique. I placed a 5 mm port in mid subcostal region, another 103mm port in the right flank near the anterior axillary line, and a 80mm port in the left subxiphoid region obliquely within the falciform ligament.  I turned attention to the right upper quadrant.  The gallbladder fundus was elevated cephalad. I used cautery and blunt dissection to free the peritoneal coverings between the gallbladder and the liver on the posteriolateral and anteriomedial walls.   I used careful blunt and cautery dissection with a maryland dissector to help get a good critical view of the cystic artery and cystic duct. I did further dissection to free a few centimeters of the  gallbladder off the liver bed to get a good critical view of the infundibulum and cystic duct. I mobilized the cystic artery.  I skeletonized the cystic duct.  After getting a good 360 view, I decided to perform a cholangiogram.  I placed a clip on the infundibulum.   I did a partial cystic duct-otomy and ensured patency. I placed a 5 F cholangiocatheter through a puncture site at the right subcostal ridge of the abdominal wall and directed it  into the cystic duct.  This was secured with a clip. We ran a cholangiogram with dilute radio-opaque contrast and continuous fluoroscopy.  Contrast flowed from a side branch consistent with cystic duct cannulization. Contrast flowed up the common hepatic duct into the right and  left intrahepatic chains out to secondary radicals. Contrast flowed down the common bile duct easily across the normal ampulla into the duodenum.  This was consistent with a normal cholangiogram.  I removed the cholangiocatheter.  I placed clips on the cystic duct x2 and I completed cystic duct transection.  I then closed a PDS vessel loop under that to ensure the duct was completely closed.    I placed clips on the cystic artery x3 with 2 proximally.  I ligated the cystic artery using scissors. I freed the gallbladder from its remaining attachments to the liver. I ensured hemostasis on the gallbladder fossa of the liver and elsewhere. I inspected the rest of the abdomen & detected no injury nor bleeding elsewhere.  I irrigated the RUQ with normal saline.  I removed the gallbladder through the umbilical port site.  I closed the umbilical fascia using 0 Vicryl stitches.   I closed the skin using 4-0 vicryl stitch.  Sterile dressings were applied. The patient was extubated & arrived in the PACU in stable condition.  I had discussed postoperative care with the patient in the holding area.  I will discuss operative findings and postoperative goals / instructions with the patient's family.  Instructions are written in the chart as well.

## 2017-11-11 NOTE — Progress Notes (Signed)
Lovenox per Pharmacy for DVT Prophylaxis    Pharmacy has been consulted from dosing enoxaparin (lovenox) in this patient for DVT prophylaxis.  The pharmacist has reviewed pertinent labs (Hgb _12.1__; PLT_244__), patient weight (__44_kg) and renal function (CrCl_78__mL/min) and decided that enoxaparin _30_mg SQ Q24Hrs is appropriate for this patient.  The pharmacy department will sign off at this time.  Please reconsult pharmacy if status changes or for further issues.  Thank you  Luetta Nutting PharmD, BCPS  11/11/2017, 4:45 AM

## 2017-11-11 NOTE — Transfer of Care (Signed)
Immediate Anesthesia Transfer of Care Note  Patient: Monica Nash  Procedure(s) Performed: LAPAROSCOPIC CHOLECYSTECTOMY WITH INTRAOPERATIVE CHOLANGIOGRAM (N/A Abdomen)  Patient Location: PACU  Anesthesia Type:General  Level of Consciousness: drowsy  Airway & Oxygen Therapy: Patient Spontanous Breathing and Patient connected to face mask  Post-op Assessment: Report given to RN and Post -op Vital signs reviewed and stable  Post vital signs: Reviewed and stable  Last Vitals:  Vitals Value Taken Time  BP    Temp    Pulse 82 11/11/2017 11:46 AM  Resp 15 11/11/2017 11:46 AM  SpO2 100 % 11/11/2017 11:46 AM  Vitals shown include unvalidated device data.  Last Pain:  Vitals:   11/11/17 0500  TempSrc:   PainSc: 2          Complications: No apparent anesthesia complications

## 2017-11-11 NOTE — Consult Note (Signed)
[facility]                       Consult  Note Monica Nash 07-24-1995  [id number].    Requesting MD: Stark Jock, MD Chief Complaint/Reason for Consult: symptomatic cholelithiasis with elevated LFTs   HPI:  [name] is a 22 y/o F with a PMH anemia, UTI, and cesarean section in 2016 who presented to [facility] with worsening upper abdominal pain. Pain started 3-5 days ago, described as intermittent, cramping pain that became worse, constant, and radiated up to her chest. Associated symptoms included nausea or vomiting with PO intake. Also reports some non-bloody diarrhea. She reports NKDA and denies use of blood thinning mediations. She currently works at [employer]. She lives with her mother, sister, and her son.   ER workup revealed elevated LFTs, including a bilirubin of 2.5, and a RUQ ultrasound showing a 4 mm gallstone in the cystic duct, intrahepatic ductal dilatation, but no signs of cholecystitis.   ROS: Review of Systems  Constitutional: Negative for chills and fever.  Respiratory: Negative for cough and shortness of breath.   Cardiovascular: Negative for chest pain.  Gastrointestinal: Positive for abdominal pain, diarrhea, nausea and vomiting. Negative for blood in stool.  Genitourinary: Negative for dysuria, frequency, hematuria and urgency.  All other systems reviewed and are negative.   History reviewed. No pertinent family history.  Past Medical History:  Diagnosis Date  . Anemia   . UTI (urinary tract infection)     Past Surgical History:  Procedure Laterality Date  . CESAREAN SECTION N/A 01/27/2015   Procedure: CESAREAN SECTION;  Surgeon: Truett Mainland, DO;  Location: [facility];  Service: Obstetrics;  Laterality: N/A;    Social History:  reports that she has never smoked. She has never used smokeless tobacco. She reports that she has current or past drug history. Drug: Marijuana. She reports that she does not drink alcohol.  Allergies: No Known  Allergies  Medications Prior to Admission  Medication Sig Dispense Refill  . medroxyPROGESTERone (DEPO-PROVERA) 150 MG/ML injection Inject 150 mg into the muscle every 3 (three) months.      Blood pressure 113/66, pulse (!) 47, temperature 99.1 F (37.3 C), temperature source Oral, resp. rate 18, height _0  (1.626 m), weight 44.5 kg, SpO2 100 %. Physical Exam: Physical Exam  Constitutional: She is oriented to person, place, and time. She appears well-developed and well-nourished.  Non-toxic appearance. She does not appear ill.  HENT:  Head: Normocephalic and atraumatic.  Mouth/Throat: Oropharynx is clear and moist.  Eyes: EOM are normal.  Cardiovascular: Normal rate, regular rhythm and intact distal pulses. Exam reveals no gallop and no friction rub.  No murmur heard. Pulmonary/Chest: Effort normal and breath sounds normal. No respiratory distress. She has no rhonchi. She has no rales.  Abdominal: Soft. Normal appearance and bowel sounds are normal. She exhibits no distension. There is tenderness in the right upper quadrant and epigastric area. There is no rebound and negative Murphy's sign. No hernia.  Neurological: She is alert and oriented to person, place, and time.  Skin: Skin is warm and dry. No rash noted. No erythema.  Psychiatric: She has a normal mood and affect. Her behavior is normal.    Results for orders placed or performed during the hospital encounter of 11/10/17 (from the past 48 hour(s))  Lipase, blood     Status: None   Collection Time: 11/10/17  9:52 PM  Result Value Ref Range  Lipase 26 11 - 51 U/L    Comment: Performed at [facility], [place] [address] [place], [place] [address]  Comprehensive metabolic panel     Status: Abnormal   Collection Time: 11/10/17  9:52 PM  Result Value Ref Range   Sodium 142 135 - 145 mmol/L   Potassium 3.3 (L) 3.5 - 5.1 mmol/L   Chloride 103 98 - 111 mmol/L   CO2 28 22 - 32 mmol/L   Glucose, Bld 98 70 - 99  mg/dL   BUN 10 6 - 20 mg/dL   Creatinine, Ser 0.68 0.44 - 1.00 mg/dL   Calcium 10.0 8.9 - 10.3 mg/dL   Total Protein 8.2 (H) 6.5 - 8.1 g/dL   Albumin 5.1 (H) 3.5 - 5.0 g/dL   AST 223 (H) 15 - 41 U/L   ALT 508 (H) 0 - 44 U/L   Alkaline Phosphatase 125 38 - 126 U/L   Total Bilirubin 2.5 (H) 0.3 - 1.2 mg/dL   GFR calc non Af Amer >60 >60 mL/min   GFR calc Af Amer >60 >60 mL/min    Comment: (NOTE) The eGFR has been calculated using the CKD EPI equation. This calculation has not been validated in all clinical situations. eGFR's persistently <60 mL/min signify possible Chronic Kidney Disease.    Anion gap 11 5 - 15    Comment: Performed at [facility], [place] [address], [place], [place] [address]  CBC     Status: None   Collection Time: 11/10/17  9:52 PM  Result Value Ref Range   WBC 6.0 4.0 - 10.5 K/uL   RBC 4.23 3.87 - 5.11 MIL/uL   Hemoglobin 12.1 12.0 - 15.0 g/dL   HCT 36.6 36.0 - 46.0 %   MCV 86.5 78.0 - 100.0 fL   MCH 28.6 26.0 - 34.0 pg   MCHC 33.1 30.0 - 36.0 g/dL   RDW 14.5 11.5 - 15.5 %   Platelets 244 150 - 400 K/uL    Comment: Performed at [facility], [place] [address], [place], [place] [address]  Urinalysis, Routine w reflex microscopic     Status: Abnormal   Collection Time: 11/10/17  9:59 PM  Result Value Ref Range   Color, Urine AMBER (A) YELLOW    Comment: BIOCHEMICALS MAY BE AFFECTED BY COLOR   APPearance CLEAR CLEAR   Specific Gravity, Urine 1.030 1.005 - 1.030   pH 6.0 5.0 - 8.0   Glucose, UA NEGATIVE NEGATIVE mg/dL   Hgb urine dipstick SMALL (A) NEGATIVE   Bilirubin Urine MODERATE (A) NEGATIVE   Ketones, ur NEGATIVE NEGATIVE mg/dL   Protein, ur 100 (A) NEGATIVE mg/dL   Nitrite NEGATIVE NEGATIVE   Leukocytes, UA NEGATIVE NEGATIVE   RBC / HPF 21-50 0 - 5 RBC/hpf   WBC, UA 6-10 0 - 5 WBC/hpf   Bacteria, UA RARE (A) NONE SEEN   Squamous Epithelial / LPF 0-5 0 - 5   Mucus PRESENT     Comment: Performed at [facility], [place] [address] [place], [place] [address]  Pregnancy, urine     Status: None   Collection Time: 11/10/17  9:59 PM  Result Value Ref Range   Preg Test, Ur NEGATIVE NEGATIVE    Comment:        THE SENSITIVITY OF THIS METHODOLOGY IS >20 mIU/mL. Performed at [facility], [place] [address], [place], [place] [address]   Comprehensive metabolic panel  Status: Abnormal   Collection Time: 11/11/17  5:19 AM  Result Value Ref Range   Sodium 143 135 - 145 mmol/L   Potassium 3.6 3.5 - 5.1 mmol/L   Chloride 110 98 - 111 mmol/L   CO2 24 22 - 32 mmol/L   Glucose, Bld 97 70 - 99 mg/dL   BUN 10 6 - 20 mg/dL   Creatinine, Ser 0.66 0.44 - 1.00 mg/dL   Calcium 9.1 8.9 - 10.3 mg/dL   Total Protein 6.5 6.5 - 8.1 g/dL   Albumin 4.1 3.5 - 5.0 g/dL   AST 194 (H) 15 - 41 U/L   ALT 430 (H) 0 - 44 U/L   Alkaline Phosphatase 108 38 - 126 U/L   Total Bilirubin 2.0 (H) 0.3 - 1.2 mg/dL   GFR calc non Af Amer >60 >60 mL/min   GFR calc Af Amer >60 >60 mL/min    Comment: (NOTE) The eGFR has been calculated using the CKD EPI equation. This calculation has not been validated in all clinical situations. eGFR's persistently <60 mL/min signify possible Chronic Kidney Disease.    Anion gap 9 5 - 15    Comment: Performed at [facility], [place] [address] [place], [place] [address]  CBC     Status: Abnormal   Collection Time: 11/11/17  5:19 AM  Result Value Ref Range   WBC 4.9 4.0 - 10.5 K/uL   RBC 3.63 (L) 3.87 - 5.11 MIL/uL   Hemoglobin 10.5 (L) 12.0 - 15.0 g/dL   HCT 31.3 (L) 36.0 - 46.0 %   MCV 86.2 78.0 - 100.0 fL   MCH 28.9 26.0 - 34.0 pg   MCHC 33.5 30.0 - 36.0 g/dL   RDW 14.6 11.5 - 15.5 %   Platelets 188 150 - 400 K/uL    Comment: Performed at [facility], [place] [address] [place], [place] [address]   US Abdomen Limited Ruq  Result Date: 11/11/2017 CLINICAL DATA:  22 y/o F; two weeks of right upper quadrant abdominal  pain and transaminitis. EXAM: ULTRASOUND ABDOMEN LIMITED RIGHT UPPER QUADRANT COMPARISON:  None. FINDINGS: Gallbladder: Multiple gallstones measuring up to 1 cm. 4 mm stone in the gallbladder neck. No gallbladder wall thickening or pericholecystic fluid. Negative sonographic Murphy's sign. Common bile duct: Diameter: 3.5 mm Liver: No focal lesion identified. Within normal limits in parenchymal echogenicity. Intrahepatic biliary ductal dilatation. Portal vein is patent on color Doppler imaging with normal direction of blood flow towards the liver. IMPRESSION: 1. Intrahepatic biliary ductal dilatation, no extrahepatic biliary ductal dilatation, 4 mm stone in the cystic duct, question Mirizzi syndrome. Consider further characterization with MRI/MRCP. 2. Cholelithiasis.  No secondary findings of acute cholecystitis. Electronically Signed   By: Kristine Garbe M.D.   On: 11/11/2017 02:39    Assessment/Plan Symptomatic cholelithiasis  Elevated liver enzymes  - clinical picture consistent with worsening biliary colic, elevated LFTs and bilirubin secondary to possible passed CBD stone/external compression from gallstone in gallbladder neck.  - recommend laparoscopic cholecystectomy with IOC today  - NPO, IV, abx on call to [place], [facility] 11/11/2017, 7:28 AM Pager: [phone] Consults: [phone]

## 2017-11-11 NOTE — Progress Notes (Signed)
Patient admitted earlier today by Dr. Madelyn Flavors. See H&P for details.  22 year old female with a history of anemia presented with complaints of worsening epigastric abdominal pain.  Elevated LFTs and symptomatic cholelithiasis.  Surgery consulted and appreciated.  Plan for laparoscopic cholecystectomy with IOC today.  General surgery has also agreed to take patient onto their service. [facility] will sign off, please let us know if we can be of further service.  Time spent: 10 minutes  Trini Soldo D.O. [facility] Pager [phone]  If 7PM-7AM, please contact night-coverage [email or web address] Password [email or web address] 11/11/2017, 9:50 AM

## 2017-11-11 NOTE — H&P (Addendum)
History and Physical    [name]:[id number] DOB: September 09, 1995 DOA: 11/10/2017  Referring MD/NP/PA: Graciella Freer, PA-C PCP: Patient, No Pcp Per  Patient coming from: home  Chief Complaint: Abdominal pain  I have personally briefly reviewed patient's old medical records in [place] [facility]   HPI: Monica Nash is a 22 y.o. female with medical history significant of anemia; who presents with 2 weeks of progressive worsening epigastric abdominal pain.  Pain is described as crampy in nature and usually provoked immediately after eating.  Patient reports vomiting with some mild relief of symptoms.  Emesis is noted to be nonbloody in appearance.  Initially thought symptoms were related to a urinary tract infection. She had gone to [facility] hospital for evaluation, but was advised to come to the emergency department for further evaluation.  No previous history of abdominal surgeries.  Denies having any fever, blood in stool/urine, chest pain, dysuria, or urinary frequency.   ED Course: Upon admission into the emergency department patient was seen to be afebrile with vital signs relatively within normal limits.  Labs significant for potassium 3.3, AST 223, ALT 508, and total bilirubin 2.5.  Urinalysis did not show clear signs of infection.  Ultrasound of the right upper quadrant revealed a 4 mm stone in the cystic duct, intrahepatic dilatation, and cholelithiasis.  General surgery Dr. Gerrit Friends was consulted, but recommended hospitalist admission for work-up of elevated liver enzymes and need of GI consultative services.  Review of Systems  Constitutional: Positive for malaise/fatigue. Negative for fever.  HENT: Negative for congestion and ear discharge.   Eyes: Negative for photophobia and pain.  Respiratory: Negative for cough and shortness of breath.   Cardiovascular: Negative for chest pain and leg swelling.  Gastrointestinal: Positive for abdominal pain, nausea and vomiting. Negative for  blood in stool.  Genitourinary: Negative for dysuria and hematuria.  Musculoskeletal: Negative for back pain and myalgias.  Neurological: Negative for focal weakness and loss of consciousness.  Psychiatric/Behavioral: Positive for substance abuse. The patient is not nervous/anxious.     Past Medical History:  Diagnosis Date  . Anemia   . UTI (urinary tract infection)     Past Surgical History:  Procedure Laterality Date  . CESAREAN SECTION N/A 01/27/2015   Procedure: CESAREAN SECTION;  Surgeon: Levie Heritage, DO;  Location: [facility];  Service: Obstetrics;  Laterality: N/A;     reports that she has never smoked. She has never used smokeless tobacco. She reports that she has current or past drug history. Drug: Marijuana. She reports that she does not drink alcohol.  No Known Allergies  History reviewed. No pertinent family history.  Prior to Admission medications   Medication Sig Start Date End Date Taking? Authorizing Provider  medroxyPROGESTERone (DEPO-PROVERA) 150 MG/ML injection Inject 150 mg into the muscle every 3 (three) months.   Yes [provider]    Physical Exam:  Constitutional: young female in no acute distress at this time. Vitals:   11/10/17 2116 11/11/17 0004 11/11/17 0225  BP: (!) 100/59 114/76 (!) 115/54  Pulse: 72 (!) 59 85  Resp: 16 18 14   Temp: 98.6 F (37 C)    TempSrc: Oral    SpO2: 100% 95% 98%  Weight: 44.5 kg    Height: 5\' 4"  (1.626 m)     Eyes: PERRL, lids and conjunctivae normal ENMT: Mucous membranes are moist. Posterior pharynx clear of any exudate or lesions.Normal dentition.  Neck: normal, supple, no masses, no thyromegaly Respiratory: clear to auscultation bilaterally,  no wheezing, no crackles. Normal respiratory effort. No accessory muscle use.  Cardiovascular: Regular rate and rhythm, no murmurs / rubs / gallops. No extremity edema. 2+ pedal pulses. No carotid bruits.  Abdomen: no tenderness, no masses palpated. No  hepatosplenomegaly. Bowel sounds positive.  Musculoskeletal: no clubbing / cyanosis. No joint deformity upper and lower extremities. Good ROM, no contractures. Normal muscle tone.  Skin: no rashes, lesions, ulcers. No induration Neurologic: CN 2-12 grossly intact. Sensation intact, DTR normal. Strength 5/5 in all 4.  Psychiatric: Normal judgment and insight. Alert and oriented x 3. Normal mood.     Labs on Admission: I have personally reviewed following labs and imaging studies  CBC: Recent Labs  Lab 11/10/17 2152  WBC 6.0  HGB 12.1  HCT 36.6  MCV 86.5  PLT 244   Basic Metabolic Panel: Recent Labs  Lab 11/10/17 2152  NA 142  K 3.3*  CL 103  CO2 28  GLUCOSE 98  BUN 10  CREATININE 0.68  CALCIUM 10.0   GFR: Estimated Creatinine Clearance: 78.8 mL/min (by C-G formula based on SCr of 0.68 mg/dL). Liver Function Tests: Recent Labs  Lab 11/10/17 2152  AST 223*  ALT 508*  ALKPHOS 125  BILITOT 2.5*  PROT 8.2*  ALBUMIN 5.1*   Recent Labs  Lab 11/10/17 2152  LIPASE 26   No results for input(s): AMMONIA in the last 168 hours. Coagulation Profile: No results for input(s): INR, PROTIME in the last 168 hours. Cardiac Enzymes: No results for input(s): CKTOTAL, CKMB, CKMBINDEX, TROPONINI in the last 168 hours. BNP (last 3 results) No results for input(s): PROBNP in the last 8760 hours. HbA1C: No results for input(s): HGBA1C in the last 72 hours. CBG: No results for input(s): GLUCAP in the last 168 hours. Lipid Profile: No results for input(s): CHOL, HDL, LDLCALC, TRIG, CHOLHDL, LDLDIRECT in the last 72 hours. Thyroid Function Tests: No results for input(s): TSH, T4TOTAL, FREET4, T3FREE, THYROIDAB in the last 72 hours. Anemia Panel: No results for input(s): VITAMINB12, FOLATE, FERRITIN, TIBC, IRON, RETICCTPCT in the last 72 hours. Urine analysis:    Component Value Date/Time   COLORURINE AMBER (A) 11/10/2017 2159   APPEARANCEUR CLEAR 11/10/2017 2159   LABSPEC  1.030 11/10/2017 2159   PHURINE 6.0 11/10/2017 2159   GLUCOSEU NEGATIVE 11/10/2017 2159   HGBUR SMALL (A) 11/10/2017 2159   BILIRUBINUR MODERATE (A) 11/10/2017 2159   KETONESUR NEGATIVE 11/10/2017 2159   PROTEINUR 100 (A) 11/10/2017 2159   UROBILINOGEN 0.2 10/01/2014 1748   NITRITE NEGATIVE 11/10/2017 2159   LEUKOCYTESUR NEGATIVE 11/10/2017 2159   Sepsis Labs: No results found for this or any previous visit (from the past 240 hour(s)).   Radiological Exams on Admission: US Abdomen Limited Ruq  Result Date: 11/11/2017 CLINICAL DATA:  22 y/o F; two weeks of right upper quadrant abdominal pain and transaminitis. EXAM: ULTRASOUND ABDOMEN LIMITED RIGHT UPPER QUADRANT COMPARISON:  None. FINDINGS: Gallbladder: Multiple gallstones measuring up to 1 cm. 4 mm stone in the gallbladder neck. No gallbladder wall thickening or pericholecystic fluid. Negative sonographic Murphy's sign. Common bile duct: Diameter: 3.5 mm Liver: No focal lesion identified. Within normal limits in parenchymal echogenicity. Intrahepatic biliary ductal dilatation. Portal vein is patent on color Doppler imaging with normal direction of blood flow towards the liver. IMPRESSION: 1. Intrahepatic biliary ductal dilatation, no extrahepatic biliary ductal dilatation, 4 mm stone in the cystic duct, question Mirizzi syndrome. Consider further characterization with MRI/MRCP. 2. Cholelithiasis.  No secondary findings of acute cholecystitis. Electronically  Signed   By: Mitzi Hansen M.D.   On: 11/11/2017 02:39    EKG: Independently reviewed.  Normal sinus rhythm at 84 bpm  Assessment/Plan Choledocholithiasis, right upper quadrant abdominal pain: Acute.  Patient presents with upper quadrant abdominal pain with nausea and vomiting.  Abdominal ultrasound reveals intrahepatic biliary ductal dilatation with no extrahepatic dilatation and 4 mm stone in the cystic duct with question of Mirizzi syndrome.  General surgery was consulted as  patient will likely need removal of the gallbladder. - Admit to a MedSurg bed - N.p.o. - IV fluids of normal saline at 100 mL/h - Pain control - Appreciate general surgery consultative services, will follow-up for further recommendation - Will need to consult GI in a.m. for need of MRCP/ERCP  Elevated Liver enzymes: Acute.  Patient found to have acute elevation of AST to 23, ALT 508, and total bilirubin 2.5 on admission.  Suspect related to stone and cystic duct. - Check acute hepatitis panel - Recheck CMP in a.m.  Hypokalemia: Acute initial potassium noted be 3.3 on admission.  Suspect related to nausea and vomiting symptoms. - Give 30 mEq of potassium chloride IV - Continue to monitor and replace as needed  Nausea and vomiting: Acute. - Antiemetics as needed  DVT prophylaxis: lovenox Code Status: full  Family Communication: No family present at bedside Disposition Plan: Discharge home in 2 to 3 days Consults called: Surgery  Admission status: Inpatient  Clydie Braun MD [facility] Pager [phone]   If 7PM-7AM, please contact night-coverage [email or web address] Password [facility]  11/11/2017, 3:32 AM

## 2017-11-11 NOTE — Anesthesia Procedure Notes (Signed)
Procedure Name: Intubation Date/Time: 11/11/2017 10:10 AM Performed by: Claudia Desanctis, CRNA Pre-anesthesia Checklist: Patient identified, Emergency Drugs available, Suction available and Patient being monitored Patient Re-evaluated:Patient Re-evaluated prior to induction Oxygen Delivery Method: Circle system utilized Preoxygenation: Pre-oxygenation with 100% oxygen Induction Type: IV induction Ventilation: Mask ventilation without difficulty Laryngoscope Size: Mac and 3 Grade View: Grade I Tube type: Oral Tube size: 7.0 mm Number of attempts: 1 Airway Equipment and Method: Stylet Placement Confirmation: ETT inserted through vocal cords under direct vision,  positive ETCO2 and breath sounds checked- equal and bilateral Secured at: 21 cm Tube secured with: Tape Dental Injury: Teeth and Oropharynx as per pre-operative assessment

## 2017-11-12 ENCOUNTER — Encounter (HOSPITAL_COMMUNITY): Payer: Self-pay | Admitting: General Surgery

## 2017-11-12 LAB — HEPATITIS PANEL, ACUTE
HCV Ab: <0.1 s/co ratio (ref 0.0–0.9)
Hep A IgM: NEGATIVE
Hep B C IgM: NEGATIVE
Hepatitis B Surface Ag: NEGATIVE

## 2017-11-12 LAB — COMPREHENSIVE METABOLIC PANEL
ALK PHOS: 89 U/L (ref 38–126)
ALT: 336 U/L — AB (ref 0–44)
AST: 123 U/L — AB (ref 15–41)
Albumin: 4 g/dL (ref 3.5–5.0)
Anion gap: 9 (ref 5–15)
BILIRUBIN TOTAL: 1.8 mg/dL — AB (ref 0.3–1.2)
BUN: 6 mg/dL (ref 6–20)
CALCIUM: 9.3 mg/dL (ref 8.9–10.3)
CHLORIDE: 112 mmol/L — AB (ref 98–111)
CO2: 22 mmol/L (ref 22–32)
CREATININE: 0.59 mg/dL (ref 0.44–1.00)
GFR calc Af Amer: >60 mL/min (ref >60)
Glucose, Bld: 103 mg/dL — ABNORMAL HIGH (ref 70–99)
Potassium: 4 mmol/L (ref 3.5–5.1)
Sodium: 143 mmol/L (ref 135–145)
TOTAL PROTEIN: 6.4 g/dL — AB (ref 6.5–8.1)

## 2017-11-12 MED ORDER — IBUPROFEN 800 MG PO TABS: 400.0000 mg | ORAL_TABLET | Freq: Three times a day (TID) | ORAL | 0 refills | Status: AC | PRN

## 2017-11-12 MED ORDER — ACETAMINOPHEN 500 MG PO TABS: 1000.0000 mg | ORAL_TABLET | Freq: Four times a day (QID) | ORAL | 0 refills | Status: DC | PRN

## 2017-11-12 MED ORDER — OXYCODONE HCL 5 MG PO TABS: 5.0000 mg | ORAL_TABLET | ORAL | 0 refills | Status: DC | PRN

## 2017-11-12 NOTE — Progress Notes (Signed)
Discharge instructions given to pt and all questions were answered. Pt taken out to lobby via wheel chair.

## 2017-11-12 NOTE — Discharge Summary (Signed)
[facility] Discharge Summary   Patient ID: Monica Nash MRN: [id number] DOB/AGE: [birth year] 22 y.o.  Admit date: 11/10/2017 Discharge date: 11/12/2017      Discharge Diagnosis Patient Active Problem List   Diagnosis Date Noted  . Choledocholithiasis 11/11/2017  . Elevated liver enzymes 11/11/2017  . Nausea and vomiting 11/11/2017  . Hypokalemia 11/11/2017  . Chest pain 11/10/2017  . SOB (shortness of breath) 11/10/2017  . S/P primary low transverse C-section 01/27/2015    Imaging: Dg Cholangiogram Operative  Result Date: 11/11/2017 CLINICAL DATA:  Intraoperative cholangiogram during laparoscopic cholecystectomy. EXAM: INTRAOPERATIVE CHOLANGIOGRAM FLUOROSCOPY TIME:  16 seconds COMPARISON:  Right upper quadrant abdominal ultrasound - 11/11/2017 FINDINGS: Intraoperative cholangiographic images of the right upper abdominal quadrant during laparoscopic cholecystectomy are provided for review. Surgical clips overlie the expected location of the gallbladder fossa. Contrast injection demonstrates selective cannulation of the central aspect of the cystic duct. There is passage of contrast through the central aspect of the cystic duct with filling of a mildly dilated common bile duct. There is passage of contrast though the CBD and into the descending portion of the duodenum. There is minimal reflux of injected contrast into the common hepatic duct and central aspect of the non dilated intrahepatic biliary system. There are no discrete filling defects within the opacified portions of the biliary system to suggest the presence of choledocholithiasis. IMPRESSION: No evidence of choledocholithiasis. Electronically Signed   By: Simonne Come M.D.   On: 11/11/2017 12:23   US Abdomen Limited Ruq  Result Date: 11/11/2017 CLINICAL DATA:  22 y/o F; two weeks of right upper quadrant abdominal pain and transaminitis. EXAM: ULTRASOUND ABDOMEN LIMITED RIGHT UPPER QUADRANT COMPARISON:  None. FINDINGS:  Gallbladder: Multiple gallstones measuring up to 1 cm. 4 mm stone in the gallbladder neck. No gallbladder wall thickening or pericholecystic fluid. Negative sonographic Murphy's sign. Common bile duct: Diameter: 3.5 mm Liver: No focal lesion identified. Within normal limits in parenchymal echogenicity. Intrahepatic biliary ductal dilatation. Portal vein is patent on color Doppler imaging with normal direction of blood flow towards the liver. IMPRESSION: 1. Intrahepatic biliary ductal dilatation, no extrahepatic biliary ductal dilatation, 4 mm stone in the cystic duct, question Mirizzi syndrome. Consider further characterization with MRI/MRCP. 2. Cholelithiasis.  No secondary findings of acute cholecystitis. Electronically Signed   By: Mitzi Hansen M.D.   On: 11/11/2017 02:39    Procedures Dr. Romie Levee (11/11/17) - Laparoscopic Cholecystectomy with [facility]   Hospital Course:  22 y/o F who presented to [facility] with a 2 week history of worsening abdominal pain.  Workup showed elevated LFTs, bilirubin of 2.5, and RUQ U/S showing 4 mm gallstone in the cystic duct.  Patient was admitted, bilirubin was repeated on hospital day #1 and was trending down, and underwent procedure listed above.  Tolerated procedure well and was transferred to the floor.  Diet was advanced as tolerated.  On POD#1, the patients vitals were stable, LFTs/bilirubin trending down, voiding well, tolerating diet, ambulating well, pain well controlled, incisions c/d/i and felt stable for discharge home.  Patient will follow up in our office in 2 weeks and knows to call with questions or concerns.  She will call to confirm appointment date/time.    Physical Exam: General:  Alert, NAD, pleasant, comfortable Abd:  Soft, ND, mild tenderness, incisions C/D/I  Allergies as of 11/12/2017   No Known Allergies     Medication List    TAKE these medications   acetaminophen 500 MG tablet Commonly known as:  TYLENOL Take 2 tablets  (1,000 mg total) by mouth every 6 (six) hours as needed.   ibuprofen 800 MG tablet Commonly known as:  ADVIL,MOTRIN Take 0.5-1 tablets (400-800 mg total) by mouth every 8 (eight) hours as needed for up to 7 days.   medroxyPROGESTERone 150 MG/ML injection Commonly known as:  DEPO-PROVERA Inject 150 mg into the muscle every 3 (three) months.   oxyCODONE 5 MG immediate release tablet Commonly known as:  Oxy IR/ROXICODONE Take 1 tablet (5 mg total) by mouth every 4 (four) hours as needed for moderate pain.      Follow-up Information    [facility] Follow up on 11/25/2017.   Specialty:  General Surgery Why:  at 10:30 AM for post-operative follow up. please arrive 30 minutes early. Contact information: [address] [place] [address] [phone]         Signed: Hosie Spangle, [facility] 11/12/2017, 12:23 PM

## 2018-03-09 ENCOUNTER — Ambulatory Visit: Payer: Medicaid Other | Admitting: Advanced Practice Midwife

## 2018-04-02 ENCOUNTER — Ambulatory Visit (INDEPENDENT_AMBULATORY_CARE_PROVIDER_SITE_OTHER): Payer: Medicaid Other | Admitting: Family Medicine

## 2018-04-02 ENCOUNTER — Other Ambulatory Visit (HOSPITAL_COMMUNITY)
Admission: RE | Admit: 2018-04-02 | Discharge: 2018-04-02 | Disposition: A | Discharge status: Home / Self Care | Payer: Medicaid Other | Source: Ambulatory Visit | Attending: Advanced Practice Midwife | Admitting: Advanced Practice Midwife

## 2018-04-02 ENCOUNTER — Ambulatory Visit: Payer: Medicaid Other | Admitting: Obstetrics and Gynecology

## 2018-04-02 ENCOUNTER — Encounter: Payer: Self-pay | Admitting: Family Medicine

## 2018-04-02 VITALS — BP 99/49 | HR 71 | Ht 64.0 in | Wt 94.1 lb

## 2018-04-02 DIAGNOSIS — Z01419 Encounter for gynecological examination (general) (routine) without abnormal findings: Secondary | ICD-10-CM | POA: Diagnosis not present

## 2018-04-02 DIAGNOSIS — Z3042 Encounter for surveillance of injectable contraceptive: Secondary | ICD-10-CM | POA: Diagnosis not present

## 2018-04-02 MED ORDER — MEDROXYPROGESTERONE ACETATE 150 MG/ML IM SUSP
150.0000 mg | Freq: Once | INTRAMUSCULAR | Status: AC
  Administered 2018-04-02: 150 mg via INTRAMUSCULAR

## 2018-04-02 NOTE — Progress Notes (Signed)
   Subjective:    Rolene CourseMoesha Macchi - 23 y.o. female MRN [id number]  Date of birth: 1995-04-12  HPI  Rolene CourseMoesha Brouillet is a 23 y.o. [id number] female here for pap. No concerns today. Would also like depo shot. Reports that this is her 2nd pap. Previously normal. Declines STI testing with pap today.      OB History    Gravida  2   Para  1   Term  1   Preterm      AB  1   Living  1     SAB  1   TAB      Ectopic      Multiple  0   Live Births  1             Health Maintenance:   Health Maintenance Due  Topic Date Due  . TETANUS/TDAP  03/19/2016  . CHLAMYDIA SCREENING  06/06/2017  . INFLUENZA VACCINE  10/02/2017  . PAP-Cervical Cytology Screening  03/19/2018  . PAP SMEAR-Modifier  03/19/2018    -  reports that she has never smoked. She has never used smokeless tobacco. - Review of Systems: Per HPI. - Past Medical History: Patient Active Problem List   Diagnosis Date Noted  . Choledocholithiasis 11/11/2017  . Elevated liver enzymes 11/11/2017  . Nausea and vomiting 11/11/2017  . Hypokalemia 11/11/2017  . Chest pain 11/10/2017  . SOB (shortness of breath) 11/10/2017  . S/P primary low transverse C-section 01/27/2015   - Medications: reviewed and updated   Objective:   Physical Exam BP (!) 99/49   Pulse 71   Ht 5\' 4"  (1.626 m)   Wt 94 lb 1.6 oz (42.7 kg)   BMI 16.15 kg/m  Gen: NAD, alert, cooperative with exam, well-appearing HEENT: NCAT, PERRL, clear conjunctiva, oropharynx clear, supple neck CV: RRR, good S1/S2, no murmur, no edema, capillary refill brisk  Resp: CTABL, no wheezes, non-labored Abd: SNTND, BS present, no guarding or organomegaly Skin: no rashes, normal turgor  Neuro: no gross deficits.  Psych: good insight, alert and oriented GU/GYN: Exam performed in the presence of a chaperone. External genitalia within normal limits.  Vaginal mucosa pink, moist, normal rugae.  Nonfriable cervix without lesions, no discharge or bleeding noted on  speculum exam.     Assessment & Plan:   1. Gynecologic exam normal - Cytology - PAP  2. Encounter for surveillance of injectable contraceptive - medroxyPROGESTERone (DEPO-PROVERA) injection 150 mg   Routine preventative health maintenance measures emphasized. Please refer to After Visit Summary for other counseling recommendations.   Return if symptoms worsen or fail to improve.  Gwenevere AbbotNimeka Aseret Hoffman, MD  OB Fellow  04/02/2018, 5:31 PM

## 2018-04-03 LAB — POCT PREGNANCY, URINE: Preg Test, Ur: NEGATIVE

## 2018-04-07 LAB — CYTOLOGY - PAP
BACTERIAL VAGINITIS: POSITIVE — AB
CANDIDA VAGINITIS: NEGATIVE
CHLAMYDIA, DNA PROBE: POSITIVE — AB
Diagnosis: NEGATIVE
Neisseria Gonorrhea: NEGATIVE
Trichomonas: NEGATIVE

## 2018-04-08 ENCOUNTER — Other Ambulatory Visit: Payer: Self-pay | Admitting: Family Medicine

## 2018-04-08 MED ORDER — AZITHROMYCIN 500 MG PO TABS: 1000.0000 mg | ORAL_TABLET | Freq: Once | ORAL | 0 refills | Status: AC

## 2018-04-08 MED ORDER — METRONIDAZOLE 500 MG PO TABS: 500.0000 mg | ORAL_TABLET | Freq: Two times a day (BID) | ORAL | 0 refills | Status: DC

## 2018-04-08 NOTE — Progress Notes (Signed)
Called patient to discuss positive for chlamydia and BV at recent visit. Patient did not answer and unable to leave voicemail.  Instead of delaying treatment, sent Rx and mychart message to patient with results.   Gwenevere Abbot, MD 04/08/18 10:17 AM

## 2018-04-10 LAB — CERVICOVAGINAL ANCILLARY ONLY: Herpes: NEGATIVE

## 2018-04-13 ENCOUNTER — Telehealth: Payer: Self-pay | Admitting: General Practice

## 2018-04-13 NOTE — Telephone Encounter (Signed)
Patient called and left message on nurse voicemail line stating she is calling for her results. Patient states she sees where two prescriptions are at her pharmacy but she doesn't know what for.  Called patient & informed her of results, importance of partner treatment & abstaining from intercourse. Patient verbalized understanding & had no questions. STD card completed.

## 2018-05-04 ENCOUNTER — Encounter (HOSPITAL_COMMUNITY): Payer: Self-pay

## 2018-05-04 ENCOUNTER — Emergency Department (HOSPITAL_COMMUNITY)
Admission: EM | Admit: 2018-05-04 | Discharge: 2018-05-04 | Disposition: A | Discharge status: Home / Self Care | Payer: Medicaid Other | Attending: Emergency Medicine | Admitting: Emergency Medicine

## 2018-05-04 ENCOUNTER — Other Ambulatory Visit: Payer: Self-pay

## 2018-05-04 DIAGNOSIS — N760 Acute vaginitis: Secondary | ICD-10-CM | POA: Diagnosis not present

## 2018-05-04 DIAGNOSIS — R21 Rash and other nonspecific skin eruption: Secondary | ICD-10-CM | POA: Diagnosis present

## 2018-05-04 DIAGNOSIS — B9689 Other specified bacterial agents as the cause of diseases classified elsewhere: Secondary | ICD-10-CM | POA: Insufficient documentation

## 2018-05-04 LAB — URINALYSIS, ROUTINE W REFLEX MICROSCOPIC
Bilirubin Urine: NEGATIVE
GLUCOSE, UA: NEGATIVE mg/dL
Ketones, ur: NEGATIVE mg/dL
Nitrite: NEGATIVE
PH: 6 (ref 5.0–8.0)
Protein, ur: NEGATIVE mg/dL
Specific Gravity, Urine: 1.021 (ref 1.005–1.030)

## 2018-05-04 LAB — WET PREP, GENITAL
Sperm: NONE SEEN
Trich, Wet Prep: NONE SEEN
Yeast Wet Prep HPF POC: NONE SEEN

## 2018-05-04 LAB — PREGNANCY, URINE: Preg Test, Ur: NEGATIVE

## 2018-05-04 MED ORDER — METRONIDAZOLE 500 MG PO TABS: 500.0000 mg | ORAL_TABLET | Freq: Two times a day (BID) | ORAL | 0 refills | Status: DC

## 2018-05-04 NOTE — ED Provider Notes (Signed)
[facility] Provider Note   [name]: [id number] Arrival date & time: 05/04/18  1636    History   Chief Complaint Chief Complaint  Patient presents with  . Vaginal Itching    HPI [name] is a 23 y.o. female.     Patient is a 23 year old female with past medical history of choledocholithiasis who presents emergency department for vaginal itching.  She reports that this is been going on for about 2 weeks.  She reports that this started after taking a round of antibiotic medication chlamydia.  Reports that she is still sexually active but that she did not have sexual intercourse with her partner until they were both treated for chlamydia.  She denies any vaginal discharge, pelvic pain, dysuria, hematuria.  Reports that she just has itching in the vaginal area and some irritation after she urinates.  She has not tried anything for relief.     Past Medical History:  Diagnosis Date  . Anemia   . UTI (urinary tract infection)     Patient Active Problem List   Diagnosis Date Noted  . Choledocholithiasis 11/11/2017  . Elevated liver enzymes 11/11/2017  . Nausea and vomiting 11/11/2017  . Hypokalemia 11/11/2017  . Chest pain 11/10/2017  . SOB (shortness of breath) 11/10/2017  . S/P primary low transverse C-section 01/27/2015    Past Surgical History:  Procedure Laterality Date  . CESAREAN SECTION N/A 01/27/2015   Procedure: CESAREAN SECTION;  Surgeon: Levie HeritageJacob J Stinson, DO;  Location: [facility] ORS;  Service: Obstetrics;  Laterality: N/A;  . CHOLECYSTECTOMY N/A 11/11/2017   Procedure: LAPAROSCOPIC CHOLECYSTECTOMY WITH INTRAOPERATIVE CHOLANGIOGRAM;  Surgeon: Romie Leveehomas, Alicia, MD;  Location: [facility] ORS;  Service: General;  Laterality: N/A;     OB History    Gravida  2   Para  1   Term  1   Preterm      AB  1   Living  1     SAB  1   TAB      Ectopic      Multiple  0   Live Births  1            Home Medications    Prior to  Admission medications   Medication Sig Start Date End Date Taking? Authorizing Provider  acetaminophen (TYLENOL) 500 MG tablet Take 2 tablets (1,000 mg total) by mouth every 6 (six) hours as needed. 11/12/17   Adam PhenixSimaan, Elizabeth S, PA-C  metroNIDAZOLE (FLAGYL) 500 MG tablet Take 1 tablet (500 mg total) by mouth 2 (two) times daily. 05/04/18   Arlyn DunningMcLean, Daveena Elmore A, PA-C    Family History History reviewed. No pertinent family history.  Social History Social History   Tobacco Use  . Smoking status: Never Smoker  . Smokeless tobacco: Never Used  Substance Use Topics  . Alcohol use: No  . Drug use: Yes    Types: Marijuana     Allergies   Patient has no known allergies.   Review of Systems Review of Systems  Constitutional: Negative for chills and fever.  HENT: Negative for ear pain and sore throat.   Eyes: Negative for pain and visual disturbance.  Respiratory: Negative for cough and shortness of breath.   Cardiovascular: Negative for chest pain and palpitations.  Gastrointestinal: Negative for abdominal pain, diarrhea, nausea and vomiting.  Genitourinary: Negative for decreased urine volume, difficulty urinating, dyspareunia, dysuria, enuresis, flank pain, frequency, genital sores, hematuria, menstrual problem, pelvic pain, urgency, vaginal bleeding, vaginal discharge and  vaginal pain.       Vaginal itching  Musculoskeletal: Negative for arthralgias and back pain.  Skin: Negative for color change and rash.  Neurological: Negative for seizures and syncope.  All other systems reviewed and are negative.    Physical Exam Updated Vital Signs BP (!) 115/48   Pulse (!) 59   Temp 98.1 F (36.7 C) (Oral)   Resp 18   SpO2 100%   Physical Exam Vitals signs and nursing note reviewed. Exam conducted with a chaperone present.  HENT:     Head: Normocephalic and atraumatic.     Nose: Nose normal.     Mouth/Throat:     Mouth: Mucous membranes are moist.  Eyes:     Conjunctiva/sclera:  Conjunctivae normal.     Pupils: Pupils are equal, round, and reactive to light.  Cardiovascular:     Rate and Rhythm: Normal rate and regular rhythm.  Pulmonary:     Effort: Pulmonary effort is normal.     Breath sounds: Normal breath sounds. No wheezing, rhonchi or rales.  Abdominal:     General: Abdomen is flat. Bowel sounds are normal. There is no distension.     Tenderness: There is no abdominal tenderness. There is no right CVA tenderness or left CVA tenderness.  Genitourinary:    Labia:        Right: No rash, tenderness, lesion or injury.        Left: No rash, tenderness, lesion or injury.      Vagina: Normal.     Cervix: Normal.     Adnexa: Right adnexa normal and left adnexa normal.  Musculoskeletal:     Right lower leg: No edema.     Left lower leg: No edema.  Skin:    General: Skin is warm.     Capillary Refill: Capillary refill takes less than 2 seconds.  Neurological:     General: No focal deficit present.     Mental Status: She is alert.  Psychiatric:        Mood and Affect: Mood normal.      ED Treatments / Results  Labs (all labs ordered are listed, but only abnormal results are displayed) Labs Reviewed  WET PREP, GENITAL - Abnormal; Notable for the following components:      Result Value   Clue Cells Wet Prep HPF POC PRESENT (*)    WBC, Wet Prep HPF POC MANY (*)    All other components within normal limits  URINALYSIS, ROUTINE W REFLEX MICROSCOPIC - Abnormal; Notable for the following components:   APPearance HAZY (*)    Hgb urine dipstick MODERATE (*)    Leukocytes,Ua SMALL (*)    Bacteria, UA RARE (*)    All other components within normal limits  PREGNANCY, URINE  GC/CHLAMYDIA PROBE AMP ([place]) NOT AT [facility]    EKG None  Radiology No results found.  Procedures Procedures (including critical care time)  Medications Ordered in ED Medications - No data to display   Initial Impression / Assessment and Plan / ED Course  I have  reviewed the triage vital signs and the nursing notes.  Pertinent labs & imaging results that were available during my care of the patient were reviewed by me and considered in my medical decision making (see chart for details).  Clinical Course as of May 03 1898  Mon May 04, 2018  1749 Patient here with vaginal itching.  No discharge, no dysuria, no abdominal pain, no fever, no nausea  vomiting.  Recently treated for chlamydia.  Reports that she did not have sexual intercourse again with her partner until they were both treated.  I am going to culture her urine and get GC chlamydia.  Her pregnancy test was negative and her wet prep shows clue cells but no yeast.  No Trichomonas.  I am going to treat her with Flagyl.  She does have my chart and she understands that she needs to check the results of GC and chlamydia and be treated if anything is positive.  She noted verbal understanding. Her partner was also in the room and educated and understands no sexual intercourse until they have both re-tested negative for chlamydia.    [KM]    Clinical Course User Index [KM] Arlyn Dunning, PA-C       Based on review of vitals, medical screening exam, lab work and/or imaging, there does not appear to be an acute, emergent etiology for the patient's symptoms. Counseled pt on good return precautions and encouraged both PCP and ED follow-up as needed.   Clinical Impression: 1. BV (bacterial vaginosis)     Disposition: Discharge .  This note was prepared with assistance of Conservation officer, historic buildings. Occasional wrong-word or sound-a-like substitutions may have occurred due to the inherent limitations of voice recognition software.   Final Clinical Impressions(s) / ED Diagnoses   Final diagnoses:  BV (bacterial vaginosis)    ED Discharge Orders         Ordered    metroNIDAZOLE (FLAGYL) 500 MG tablet  2 times daily     05/04/18 1900           Jeral Pinch 05/04/18 1900     Gerhard Munch, MD 05/05/18 2026

## 2018-05-04 NOTE — ED Triage Notes (Signed)
Pt here with vaginal itching for the last week.  No odor or discharge just itching. No abd pain or nausea.

## 2018-05-04 NOTE — Discharge Instructions (Signed)
No sexual intercourse until you know the results of your STD testing are negative.  Take medication as prescribed.  Do not drink alcohol with this medication.  Return if you have any new or worsening symptoms.  Follow-up with your primary care doctor or the health department.  Make sure your partner has been cured of any STD before you have sexual intercourse again.

## 2018-05-04 NOTE — ED Notes (Signed)
Patient verbalized understanding of discharge instructions and denies any further needs or questions at this time. VS stable. Patient ambulatory with steady gait.  

## 2018-05-05 LAB — GC/CHLAMYDIA PROBE AMP (~~LOC~~) NOT AT ARMC
Chlamydia: NEGATIVE
NEISSERIA GONORRHEA: NEGATIVE

## 2018-05-25 ENCOUNTER — Encounter: Payer: Self-pay | Admitting: *Deleted

## 2018-06-10 ENCOUNTER — Encounter: Payer: Self-pay | Admitting: *Deleted

## 2018-06-19 ENCOUNTER — Ambulatory Visit: Payer: Medicaid Other

## 2018-07-06 ENCOUNTER — Encounter: Payer: Self-pay | Admitting: Physician Assistant

## 2018-07-06 ENCOUNTER — Telehealth: Payer: Medicaid Other | Admitting: Physician Assistant

## 2018-07-06 DIAGNOSIS — N898 Other specified noninflammatory disorders of vagina: Secondary | ICD-10-CM | POA: Diagnosis not present

## 2018-07-06 DIAGNOSIS — M545 Low back pain, unspecified: Secondary | ICD-10-CM

## 2018-07-06 DIAGNOSIS — N390 Urinary tract infection, site not specified: Secondary | ICD-10-CM

## 2018-07-06 NOTE — Progress Notes (Signed)
Based on what you shared with me, I feel your condition warrants further evaluation and I recommend that you be seen for a face to face office visit.   Ms Monica Nash,  Your symptoms of UTI and back pain warrant further workup to rule out kidney infection and to have workup of your vaginal discharge.     NOTE: If you entered your credit card information for this eVisit, you will not be charged. You may see a "hold" on your card for the $35 but that hold will drop off and you will not have a charge processed.  If you are having a true medical emergency please call 911.  If you need an urgent face to face visit, [facility] has four urgent care centers for your convenience.    PLEASE NOTE: THE [facility] LOCATIONS AND URGENT CARE CLINICS DO NOT HAVE THE TESTING FOR CORONAVIRUS COVID19 AVAILABLE.  IF YOU FEEL YOU NEED THIS TEST YOU MUST GO TO A TRIAGE LOCATION AT ONE OF THE HOSPITAL EMERGENCY DEPARTMENTS   [email or web address] to reserve your spot online an avoid wait times  [facility] [address], [address] [place], [place] [address] Modified hours of operation: Monday-Friday, 12 PM to 6 PM  Saturday & Sunday 10 AM to 4 PM *Across the street from [employer]  [employer] (New Address!) [address], [address] [place], [place] [address] *Just off [employer], across the road from [place]* Modified hours of operation: Monday-Friday, 12 PM to 6 PM  Closed Saturday & Sunday  [facility]'s modified hours of operation will be in effect from May 1 until May 31   The following sites will take your insurance:  . [facility]  [phone] Get Driving Directions Find a Provider at this Location  [address] [place], [place] [address] . 10 am to 8 pm Monday-Friday . 12 pm to 8 pm Saturday-Sunday   . [facility]  [phone] Get Driving Directions Find a Provider at this Location   [address] [place], [place] [address] . 8 am to 8 pm Monday-Friday . 9 am to 6 pm Saturday . 11 am to 6 pm Sunday   . [facility] at [facility]  [phone] Get Driving Directions  [address] [place], [place] [address] . 8 am to 8 pm Monday-Friday . 8 am to 4 pm Saturday-Sunday   Your e-visit answers were reviewed by a board certified advanced clinical practitioner to complete your personal care plan.  Thank you for using e-Visits. I have spent 7 min in completion and review of this note- Illa Level [facility]

## 2018-07-07 ENCOUNTER — Encounter (HOSPITAL_COMMUNITY): Payer: Self-pay

## 2018-07-07 ENCOUNTER — Ambulatory Visit (HOSPITAL_COMMUNITY)
Admission: EM | Admit: 2018-07-07 | Discharge: 2018-07-07 | Disposition: A | Discharge status: Home / Self Care | Payer: Medicaid Other | Attending: Family Medicine | Admitting: Family Medicine

## 2018-07-07 ENCOUNTER — Other Ambulatory Visit: Payer: Self-pay

## 2018-07-07 ENCOUNTER — Telehealth: Payer: Self-pay | Admitting: Advanced Practice Midwife

## 2018-07-07 DIAGNOSIS — N760 Acute vaginitis: Secondary | ICD-10-CM | POA: Insufficient documentation

## 2018-07-07 LAB — POCT URINALYSIS DIP (DEVICE)
Bilirubin Urine: NEGATIVE
Glucose, UA: NEGATIVE mg/dL
Ketones, ur: NEGATIVE mg/dL
Leukocytes,Ua: NEGATIVE
Nitrite: NEGATIVE
Protein, ur: NEGATIVE mg/dL
Specific Gravity, Urine: 1.025 (ref 1.005–1.030)
Urobilinogen, UA: 0.2 mg/dL (ref 0.0–1.0)
pH: 7 (ref 5.0–8.0)

## 2018-07-07 LAB — POCT PREGNANCY, URINE: Preg Test, Ur: NEGATIVE

## 2018-07-07 MED ORDER — METRONIDAZOLE 500 MG PO TABS: 500.0000 mg | ORAL_TABLET | Freq: Two times a day (BID) | ORAL | 0 refills | Status: DC

## 2018-07-07 MED ORDER — FLUCONAZOLE 150 MG PO TABS: 150.0000 mg | ORAL_TABLET | Freq: Every day | ORAL | 0 refills | Status: DC

## 2018-07-07 NOTE — ED Triage Notes (Signed)
Pt states that she has painful urination with fowl odor , pain with intercourse and also states she has left flank pain

## 2018-07-07 NOTE — Discharge Instructions (Addendum)
We are treating you for a yeast and bacterial infection. Your urine was negative for pregnancy or urinary tract infection We are also sending your swab for further testing and will call with any positive results

## 2018-07-07 NOTE — ED Provider Notes (Signed)
[facility]    [name]: [id number] Arrival date & time: 07/07/18  1032     History   Chief Complaint Chief Complaint  Patient presents with  . Urinary Tract Infection    HPI Monica Nash is a 23 y.o. female.   Patient is a 23 year old female that presents today with dysuria, odor, vaginal discharge, dyspareunia and some intermittent lower abdominal discomfort.  This is been present and waxing and waning over the past couple days.  She is currently sexually active with one partner, unprotected.  Unsure of STDs.  She does have a past medical history of chlamydia.  She also was recently treated for bacterial vaginosis approximately 2 months ago.  She has had some mild itching and irritation.  She received the injection for birth control.  Denies any fevers, chills, body aches, back pain, nausea or vomiting. ROS per HPI      Past Medical History:  Diagnosis Date  . Anemia   . UTI (urinary tract infection)     Patient Active Problem List   Diagnosis Date Noted  . Choledocholithiasis 11/11/2017  . Elevated liver enzymes 11/11/2017  . Nausea and vomiting 11/11/2017  . Hypokalemia 11/11/2017  . Chest pain 11/10/2017  . SOB (shortness of breath) 11/10/2017  . S/P primary low transverse C-section 01/27/2015    Past Surgical History:  Procedure Laterality Date  . CESAREAN SECTION N/A 01/27/2015   Procedure: CESAREAN SECTION;  Surgeon: Levie Heritage, DO;  Location: [facility];  Service: Obstetrics;  Laterality: N/A;  . CHOLECYSTECTOMY N/A 11/11/2017   Procedure: LAPAROSCOPIC CHOLECYSTECTOMY WITH INTRAOPERATIVE CHOLANGIOGRAM;  Surgeon: Romie Levee, MD;  Location: [facility];  Service: General;  Laterality: N/A;    OB History    Gravida  2   Para  1   Term  1   Preterm      AB  1   Living  1     SAB  1   TAB      Ectopic      Multiple  0   Live Births  1            Home Medications    Prior to Admission medications   Medication Sig Start Date  End Date Taking? Authorizing Provider  acetaminophen (TYLENOL) 500 MG tablet Take 2 tablets (1,000 mg total) by mouth every 6 (six) hours as needed. 11/12/17   Adam Phenix, PA-C  fluconazole (DIFLUCAN) 150 MG tablet Take 1 tablet (150 mg total) by mouth daily. 07/07/18   Dahlia Byes A, NP  metroNIDAZOLE (FLAGYL) 500 MG tablet Take 1 tablet (500 mg total) by mouth 2 (two) times daily. 07/07/18   Janace Aris, NP    Family History History reviewed. No pertinent family history.  Social History Social History   Tobacco Use  . Smoking status: Never Smoker  . Smokeless tobacco: Never Used  Substance Use Topics  . Alcohol use: No  . Drug use: Yes    Types: Marijuana     Allergies   Patient has no known allergies.   Review of Systems Review of Systems   Physical Exam Triage Vital Signs ED Triage Vitals  Enc Vitals Group     BP 07/07/18 1119 98/62     Pulse Rate 07/07/18 1119 (!) 55     Resp 07/07/18 1119 18     Temp 07/07/18 1119 98.4 F (36.9 C)     Temp src --      SpO2 07/07/18 1119  100 %     Weight --      Height --      Head Circumference --      Peak Flow --      Pain Score 07/07/18 1051 6     Pain Loc --      Pain Edu? --      Excl. in GC? --    No data found.  Updated Vital Signs BP 98/62   Pulse (!) 55   Temp 98.4 F (36.9 C)   Resp 18   SpO2 100%   Visual Acuity Right Eye Distance:   Left Eye Distance:   Bilateral Distance:    Right Eye Near:   Left Eye Near:    Bilateral Near:     Physical Exam Vitals signs and nursing note reviewed.  Constitutional:      General: She is not in acute distress.    Appearance: Normal appearance. She is not ill-appearing, toxic-appearing or diaphoretic.     Comments: Pt smiling in no distress.   HENT:     Head: Normocephalic and atraumatic.     Nose: Nose normal.  Eyes:     Conjunctiva/sclera: Conjunctivae normal.  Neck:     Musculoskeletal: Normal range of motion.  Pulmonary:     Effort:  Pulmonary effort is normal.  Abdominal:     Tenderness: There is abdominal tenderness in the suprapubic area. There is no right CVA tenderness, left CVA tenderness or guarding.  Musculoskeletal: Normal range of motion.  Skin:    General: Skin is warm and dry.     Findings: No rash.  Neurological:     Mental Status: She is alert.  Psychiatric:        Mood and Affect: Mood normal.      UC Treatments / Results  Labs (all labs ordered are listed, but only abnormal results are displayed) Labs Reviewed  POCT URINALYSIS DIP (DEVICE) - Abnormal; Notable for the following components:      Result Value   Hgb urine dipstick SMALL (*)    All other components within normal limits  POC URINE PREG, ED  POCT PREGNANCY, URINE  CERVICOVAGINAL ANCILLARY ONLY    EKG None  Radiology No results found.  Procedures Procedures (including critical care time)  Medications Ordered in UC Medications - No data to display  Initial Impression / Assessment and Plan / UC Course  I have reviewed the triage vital signs and the nursing notes.  Pertinent labs & imaging results that were available during my care of the patient were reviewed by me and considered in my medical decision making (see chart for details).     Urine negative for pregnancy or infection Symptoms most consistent with either bacterial vaginosis and possible yeast infection.  Sending her self swab for further testing of STDs. We will go ahead and treat for bacterial and yeast pending results Follow up as needed for continued or worsening symptoms  Final Clinical Impressions(s) / UC Diagnoses   Final diagnoses:  Vaginitis and vulvovaginitis     Discharge Instructions     We are treating you for a yeast and bacterial infection. Your urine was negative for pregnancy or urinary tract infection We are also sending your swab for further testing and will call with any positive results    ED Prescriptions    Medication Sig  Dispense Auth. Provider   metroNIDAZOLE (FLAGYL) 500 MG tablet Take 1 tablet (500 mg total) by mouth 2 (two) times  daily. 14 tablet Anitra Doxtater A, NP   fluconazole (DIFLUCAN) 150 MG tablet Take 1 tablet (150 mg total) by mouth daily. 2 tablet Dahlia ByesBast, Nioma Mccubbins A, NP     Controlled Substance Prescriptions [place] Controlled Substance Registry consulted? Not Applicable   Janace ArisBast, Shantai Tiedeman A, NP 07/07/18 1141

## 2018-07-07 NOTE — Telephone Encounter (Signed)
The patient called in to schedule the depo injection. She stated she has not had unprotected sex in the previous 4 days as the depo injection expired 5/1. Informed the patient of the pregnacy test and the depo injection at our new location. The patient verbalized understanding.

## 2018-07-08 ENCOUNTER — Ambulatory Visit (INDEPENDENT_AMBULATORY_CARE_PROVIDER_SITE_OTHER): Payer: Medicaid Other

## 2018-07-08 ENCOUNTER — Other Ambulatory Visit: Payer: Self-pay

## 2018-07-08 DIAGNOSIS — Z3042 Encounter for surveillance of injectable contraceptive: Secondary | ICD-10-CM

## 2018-07-08 LAB — CERVICOVAGINAL ANCILLARY ONLY
Bacterial vaginitis: POSITIVE — AB
Candida vaginitis: POSITIVE — AB
Chlamydia: NEGATIVE
Neisseria Gonorrhea: NEGATIVE
Trichomonas: NEGATIVE

## 2018-07-08 MED ORDER — MEDROXYPROGESTERONE ACETATE 150 MG/ML IM SUSP: 150.0000 mg | Freq: Once | INTRAMUSCULAR | Status: DC

## 2018-07-08 NOTE — Progress Notes (Signed)
I have reviewed this chart and agree with the RN/CMA assessment and management.    K. Meryl Shauntae Reitman, M.D. Attending [facility] ([facility] Practice)   

## 2018-07-08 NOTE — Progress Notes (Signed)
Pt here today for Depo Provera however reports that she last had unprotected sex a week ago.  I informed pt that per protocol she will need either use a condom or abstain from intercourse for the next two weeks, come back, and do another pregnancy test in which comes back negative she will receive Depo.  Pt stated understanding with no further questions.

## 2018-07-22 ENCOUNTER — Ambulatory Visit (INDEPENDENT_AMBULATORY_CARE_PROVIDER_SITE_OTHER): Payer: Medicaid Other

## 2018-07-22 ENCOUNTER — Other Ambulatory Visit: Payer: Self-pay

## 2018-07-22 DIAGNOSIS — Z3042 Encounter for surveillance of injectable contraceptive: Secondary | ICD-10-CM

## 2018-07-22 DIAGNOSIS — Z3202 Encounter for pregnancy test, result negative: Secondary | ICD-10-CM

## 2018-07-22 DIAGNOSIS — Z789 Other specified health status: Secondary | ICD-10-CM

## 2018-07-22 LAB — POCT PREGNANCY, URINE: Preg Test, Ur: NEGATIVE

## 2018-07-22 MED ORDER — MEDROXYPROGESTERONE ACETATE 150 MG/ML IM SUSP
150.0000 mg | Freq: Once | INTRAMUSCULAR | Status: AC
  Administered 2018-07-22: 150 mg via INTRAMUSCULAR

## 2018-07-22 NOTE — Progress Notes (Signed)
Monica Nash here for Depo-Provera  Injection.  Injection administered without complication. Patient will return in 3 months for next injection. Pt reports denies unprotected sex within the last two weeks.  Monica Bathe, RN 07/22/2018  4:02 PM

## 2018-07-24 NOTE — Progress Notes (Signed)
I have reviewed this chart and agree with the RN/CMA assessment and management.    Iven Earnhart C Nazifa Trinka, MD, FACOG Attending Physician, Faculty Practice [facility]  

## 2018-08-08 ENCOUNTER — Telehealth: Payer: Medicaid Other | Admitting: Nurse Practitioner

## 2018-08-08 DIAGNOSIS — N898 Other specified noninflammatory disorders of vagina: Secondary | ICD-10-CM | POA: Diagnosis not present

## 2018-08-08 MED ORDER — METRONIDAZOLE 500 MG PO TABS: 500.0000 mg | ORAL_TABLET | Freq: Two times a day (BID) | ORAL | 0 refills | Status: DC

## 2018-08-08 NOTE — Progress Notes (Signed)
We are sorry that you are not feeling well. Here is how we plan to help! Based on what you shared with me it looks like you: May have a vaginosis due to bacteria. Since this is recurrent you will need to see your PCP for next episode. I suggest that youstop douching unless you are told to do so from a medical provider. Douching removes the normal flora in vaginal area that helps prevent the build up of bacteria as well as yeast.   Vaginosis is an inflammation of the vagina that can result in discharge, itching and pain. The cause is usually a change in the normal balance of vaginal bacteria or an infection. Vaginosis can also result from reduced estrogen levels after menopause.  The most common causes of vaginosis are:   Bacterial vaginosis which results from an overgrowth of one on several organisms that are normally present in your vagina.   Yeast infections which are caused by a naturally occurring fungus called candida.   Vaginal atrophy (atrophic vaginosis) which results from the thinning of the vagina from reduced estrogen levels after menopause.   Trichomoniasis which is caused by a parasite and is commonly transmitted by sexual intercourse.  Factors that increase your risk of developing vaginosis include: Marland Kitchen. Medications, such as antibiotics and steroids . Uncontrolled diabetes . Use of hygiene products such as bubble bath, vaginal spray or vaginal deodorant . Douching-  I suggest that you stop douching . Wearing damp or tight-fitting clothing . Using an intrauterine device (IUD) for birth control . Hormonal changes, such as those associated with pregnancy, birth control pills or menopause . Sexual activity . Having a sexually transmitted infection  Your treatment plan is Metronidazole or Flagyl 500mg  twice a day for 7 days.  I have electronically sent this prescription into the pharmacy that you have chosen.  Be sure to take all of the medication as directed. Stop taking any  medication if you develop a rash, tongue swelling or shortness of breath. Mothers who are breast feeding should consider pumping and discarding their breast milk while on these antibiotics. However, there is no consensus that infant exposure at these doses would be harmful.  Remember that medication creams can weaken latex condoms. Marland Kitchen.   HOME CARE:  Good hygiene may prevent some types of vaginosis from recurring and may relieve some symptoms:  . Avoid baths, hot tubs and whirlpool spas. Rinse soap from your outer genital area after a shower, and dry the area well to prevent irritation. Don't use scented or harsh soaps, such as those with deodorant or antibacterial action. Marland Kitchen. Avoid irritants. These include scented tampons and pads. . Wipe from front to back after using the toilet. Doing so avoids spreading fecal bacteria to your vagina.  Other things that may help prevent vaginosis include:  Marland Kitchen. Don't douche. Your vagina doesn't require cleansing other than normal bathing. Repetitive douching disrupts the normal organisms that reside in the vagina and can actually increase your risk of vaginal infection. Douching won't clear up a vaginal infection. . Use a latex condom. Both female and female latex condoms may help you avoid infections spread by sexual contact. . Wear cotton underwear. Also wear pantyhose with a cotton crotch. If you feel comfortable without it, skip wearing underwear to bed. Yeast thrives in [employer] Hot[employer]ist environments Your symptoms should improve in the next day or two.  GET HELP RIGHT AWAY IF:  . You have pain in your lower abdomen ( pelvic area or over your  ovaries) . You develop nausea or vomiting . You develop a fever . Your discharge changes or worsens . You have persistent pain with intercourse . You develop shortness of breath, a rapid pulse, or you faint.  These symptoms could be signs of problems or infections that need to be evaluated by a medical provider now.  MAKE SURE  YOU    Understand these instructions.  Will watch your condition.  Will get help right away if you are not doing well or get worse.  Your e-visit answers were reviewed by a board certified advanced clinical practitioner to complete your personal care plan. Depending upon the condition, your plan could have included both over the counter or prescription medications. Please review your pharmacy choice to make sure that you have choses a pharmacy that is open for you to pick up any needed prescription, Your safety is important to [place]. If you have drug allergies check your prescription carefully.   You can use MyChart to ask questions about today's visit, request a non-urgent call back, or ask for a work or school excuse for 24 hours related to this e-Visit. If it has been greater than 24 hours you will need to follow up with your provider, or enter a new e-Visit to address those concerns. You will get a MyChart message within the next two days asking about your experience. I hope that your e-visit has been valuable and will speed your recovery.  5-10 minutes spent reviewing and documenting in chart.

## 2018-10-07 ENCOUNTER — Ambulatory Visit: Payer: Medicaid Other

## 2018-11-28 ENCOUNTER — Other Ambulatory Visit: Payer: Self-pay

## 2018-11-29 ENCOUNTER — Other Ambulatory Visit: Payer: Self-pay

## 2018-12-07 ENCOUNTER — Telehealth: Payer: Medicaid Other | Admitting: Nurse Practitioner

## 2018-12-07 DIAGNOSIS — R21 Rash and other nonspecific skin eruption: Secondary | ICD-10-CM

## 2018-12-07 NOTE — Progress Notes (Signed)
Based on what you shared with me it looks like you have rash in perineal,that should be evaluated in a face to face office visit. Needs to be seen in person for proper treatment.    NOTE: If you entered your credit card information for this eVisit, you will not be charged. You may see a "hold" on your card for the $35 but that hold will drop off and you will not have a charge processed.  If you are having a true medical emergency please call 911.     For an urgent face to face visit, [place] has four urgent care centers for your convenience:   . [facility]    [phone]                  Get Driving Directions  [address], [place] [address] . 10 am to 8 pm Monday-Friday . 12 pm to 8 pm Saturday-Sunday   . [facility] at [place]                  Get Driving Directions  [address], [place], [place] [address] . 8 am to 8 pm Monday-Friday . 9 am to 6 pm Saturday . 11 am to 6 pm Sunday   . [facility] at [place]                  Get Driving Directions   [address], [place] [address] . 8 am to 8 pm Monday-Friday . 8 am to 4 pm Saturday-Sunday    . [facility] at [place]                    Get Driving Directions  [phone]  [address] [place],  [address]  . Monday-Friday, 12 PM to 6 PM    Your e-visit answers were reviewed by a board certified advanced clinical practitioner to complete your personal care plan.  Thank you for using e-Visits.

## 2018-12-10 ENCOUNTER — Telehealth: Payer: Medicaid Other | Admitting: Physician Assistant

## 2018-12-10 DIAGNOSIS — R82998 Other abnormal findings in urine: Secondary | ICD-10-CM

## 2018-12-10 NOTE — Progress Notes (Signed)
Hi Monica Nash,  I'm sorry you are not feeling well.  To best help you, I'd like you to be seen face to face by a medical provider.  I am not sure if this is a UTI or not and it is best to get a urine sample to check for bacteria. They can also assess the bumps in your genital area.   Based on what you shared with me, I feel your condition warrants further evaluation and I recommend that you be seen for a face to face visit.  Please contact [facility].   If you do not have a PCP, [place] offers a free physician referral service available at [phone]. Our trained staff has the experience, knowledge and resources to put you in touch with a physician who is right for you.   Otherwise, see below for Urgent Care locations:  [facility]    [phone]                  Get Driving Directions  [address], [place] [address] 10 am to 8 pm Monday-Friday 12 pm to 8 pm [facility] at [facility]  [phone]                  Get Driving Directions  [address], [place], [place] [address] 8 am to 8 pm Monday-Friday 9 am to 6 pm Saturday 11 am to 6 pm Sunday   [facility] [place]                  Get Driving Directions   [address] [place], [place] [address] 8 am to 8 pm Monday-Friday 8 am to 4 pm [facility] at [place]                    Get Driving Directions  [phone]  [address] [place], [place] [address]  Monday-Friday, 12 PM to 6 PM   NOTE: If you entered your credit card information for this eVisit, you will not be charged. You may see a "hold" on your card for the $35 but that hold will drop off and you will not have a charge processed.  Your e-visit answers were reviewed by a board certified advanced clinical practitioner to complete your personal care plan.  Thank  you for using e-Visits.  Greater than 5 minutes, yet less than 10 minutes of time have been spent researching, coordinating and implementing care for this patient today.

## 2018-12-11 ENCOUNTER — Other Ambulatory Visit: Payer: Self-pay

## 2018-12-11 ENCOUNTER — Emergency Department (HOSPITAL_COMMUNITY)
Admission: EM | Admit: 2018-12-11 | Discharge: 2018-12-11 | Disposition: A | Discharge status: Home / Self Care | Payer: Medicaid Other | Attending: Emergency Medicine | Admitting: Emergency Medicine

## 2018-12-11 ENCOUNTER — Encounter (HOSPITAL_COMMUNITY): Payer: Self-pay | Admitting: Emergency Medicine

## 2018-12-11 DIAGNOSIS — Z79899 Other long term (current) drug therapy: Secondary | ICD-10-CM | POA: Insufficient documentation

## 2018-12-11 DIAGNOSIS — N12 Tubulo-interstitial nephritis, not specified as acute or chronic: Secondary | ICD-10-CM | POA: Insufficient documentation

## 2018-12-11 DIAGNOSIS — R300 Dysuria: Secondary | ICD-10-CM | POA: Diagnosis present

## 2018-12-11 LAB — URINALYSIS, ROUTINE W REFLEX MICROSCOPIC
Bilirubin Urine: NEGATIVE
Glucose, UA: NEGATIVE mg/dL
Ketones, ur: NEGATIVE mg/dL
Leukocytes,Ua: NEGATIVE
Nitrite: NEGATIVE
Protein, ur: NEGATIVE mg/dL
Specific Gravity, Urine: 1.017 (ref 1.005–1.030)
pH: 6 (ref 5.0–8.0)

## 2018-12-11 LAB — CBC
HCT: 37.1 % (ref 36.0–46.0)
Hemoglobin: 11.9 g/dL — ABNORMAL LOW (ref 12.0–15.0)
MCH: 29 pg (ref 26.0–34.0)
MCHC: 32.1 g/dL (ref 30.0–36.0)
MCV: 90.3 fL (ref 80.0–100.0)
Platelets: 184 10*3/uL (ref 150–400)
RBC: 4.11 MIL/uL (ref 3.87–5.11)
RDW: 14.6 % (ref 11.5–15.5)
WBC: 5.1 10*3/uL (ref 4.0–10.5)
nRBC: 0 % (ref 0.0–0.2)

## 2018-12-11 LAB — BASIC METABOLIC PANEL
Anion gap: 9 (ref 5–15)
BUN: 7 mg/dL (ref 6–20)
CO2: 23 mmol/L (ref 22–32)
Calcium: 9.5 mg/dL (ref 8.9–10.3)
Chloride: 106 mmol/L (ref 98–111)
Creatinine, Ser: 0.84 mg/dL (ref 0.44–1.00)
GFR calc Af Amer: >60 mL/min (ref >60)
GFR calc non Af Amer: >60 mL/min (ref >60)
Glucose, Bld: 109 mg/dL — ABNORMAL HIGH (ref 70–99)
Potassium: 3.9 mmol/L (ref 3.5–5.1)
Sodium: 138 mmol/L (ref 135–145)

## 2018-12-11 LAB — I-STAT BETA HCG BLOOD, ED (MC, WL, AP ONLY): I-stat hCG, quantitative: <5 m[IU]/mL (ref <5)

## 2018-12-11 MED ORDER — CIPROFLOXACIN HCL 500 MG PO TABS: 500.0000 mg | ORAL_TABLET | Freq: Two times a day (BID) | ORAL | 0 refills | Status: AC

## 2018-12-11 MED ORDER — CIPROFLOXACIN HCL 500 MG PO TABS
500.0000 mg | ORAL_TABLET | Freq: Once | ORAL | Status: AC
  Administered 2018-12-11: 500 mg via ORAL
  Filled 2018-12-11: qty 1

## 2018-12-11 NOTE — ED Triage Notes (Signed)
Pt reports R flank pain x2 days, and urinary frequency with dark/foul smelling urine x1 week. Denies fever or chills.

## 2018-12-11 NOTE — ED Notes (Signed)
Patient verbalizes understanding of discharge instructions. Opportunity for questioning and answers were provided. Armband removed by staff, pt discharged from ED.  

## 2018-12-11 NOTE — Discharge Instructions (Signed)
Patient to drink plenty of water and take your medication as prescribed.  Return to the emergency department if you have any new or worsening symptoms.  Otherwise you should follow-up with a primary care doctor or OB/GYN.

## 2018-12-11 NOTE — ED Provider Notes (Signed)
[facility] EMERGENCY DEPARTMENT Provider Note   [name]: [id number] Arrival date & time: 12/11/18  1356     History   Chief Complaint Chief Complaint  Patient presents with  . Flank Pain  . Urinary Frequency    HPI Monica Nash is a 23 y.o. female.     Patient is a 23 year old female with past medical history of urinary tract infections who presents emergency department for dysuria.  Patient reports that for about 3 or 4 days she has had burning with urination and urgency.  Reports that she has been having right-sided flank pain over the last 2 days as well.  History of UTIs and this feels the same.  No history of kidney stones.  Denies any hematuria, vaginal discharge, pelvic pain.  She has not tried anything for relief.     Past Medical History:  Diagnosis Date  . Anemia   . UTI (urinary tract infection)     Patient Active Problem List   Diagnosis Date Noted  . Choledocholithiasis 11/11/2017  . Elevated liver enzymes 11/11/2017  . Nausea and vomiting 11/11/2017  . Hypokalemia 11/11/2017  . Chest pain 11/10/2017  . SOB (shortness of breath) 11/10/2017  . S/P primary low transverse C-section 01/27/2015    Past Surgical History:  Procedure Laterality Date  . CESAREAN SECTION N/A 01/27/2015   Procedure: CESAREAN SECTION;  Surgeon: Levie Heritage, DO;  Location: [facility];  Service: Obstetrics;  Laterality: N/A;  . CHOLECYSTECTOMY N/A 11/11/2017   Procedure: LAPAROSCOPIC CHOLECYSTECTOMY WITH INTRAOPERATIVE CHOLANGIOGRAM;  Surgeon: Romie Levee, MD;  Location: [facility];  Service: General;  Laterality: N/A;     OB History    Gravida  2   Para  1   Term  1   Preterm      AB  1   Living  1     SAB  1   TAB      Ectopic      Multiple  0   Live Births  1            Home Medications    Prior to Admission medications   Medication Sig Start Date End Date Taking? Authorizing Provider  acetaminophen (TYLENOL) 500 MG tablet Take 2  tablets (1,000 mg total) by mouth every 6 (six) hours as needed. 11/12/17   Adam Phenix, PA-C  ciprofloxacin (CIPRO) 500 MG tablet Take 1 tablet (500 mg total) by mouth every 12 (twelve) hours for 7 days. 12/11/18 12/18/18  Ronnie Doss A, PA-C  fluconazole (DIFLUCAN) 150 MG tablet Take 1 tablet (150 mg total) by mouth daily. 07/07/18   Dahlia Byes A, NP  metroNIDAZOLE (FLAGYL) 500 MG tablet Take 1 tablet (500 mg total) by mouth 2 (two) times daily. 08/08/18   Bennie Pierini, FNP    Family History No family history on file.  Social History Social History   Tobacco Use  . Smoking status: Never Smoker  . Smokeless tobacco: Never Used  Substance Use Topics  . Alcohol use: No  . Drug use: Yes    Types: Marijuana     Allergies   Patient has no known allergies.   Review of Systems Review of Systems  Constitutional: Negative for chills and fever.  HENT: Negative for congestion and sore throat.   Respiratory: Negative for cough and shortness of breath.   Cardiovascular: Negative for chest pain.  Gastrointestinal: Negative for abdominal pain, diarrhea, nausea and vomiting.  Genitourinary: Positive for dysuria and flank  pain. Negative for difficulty urinating, dyspareunia, enuresis, menstrual problem, pelvic pain, urgency, vaginal bleeding, vaginal discharge and vaginal pain.  Musculoskeletal: Negative for arthralgias.  Skin: Negative for rash.  Allergic/Immunologic: Negative for immunocompromised state.  Neurological: Negative for light-headedness.     Physical Exam Updated Vital Signs BP (!) 108/44 (BP Location: Right Arm)   Pulse 69   Temp 98.5 F (36.9 C) (Oral)   Resp 16   LMP  (LMP Unknown)   SpO2 95%   Physical Exam Vitals signs and nursing note reviewed.  Constitutional:      Appearance: Normal appearance.  HENT:     Head: Normocephalic.     Mouth/Throat:     Mouth: Mucous membranes are moist.  Eyes:     Conjunctiva/sclera: Conjunctivae normal.   Cardiovascular:     Rate and Rhythm: Normal rate and regular rhythm.  Pulmonary:     Effort: Pulmonary effort is normal.  Abdominal:     General: Abdomen is flat. Bowel sounds are normal.     Tenderness: There is no abdominal tenderness. There is right CVA tenderness. There is no left CVA tenderness or guarding.  Skin:    General: Skin is dry.  Neurological:     Mental Status: She is alert.  Psychiatric:        Mood and Affect: Mood normal.      ED Treatments / Results  Labs (all labs ordered are listed, but only abnormal results are displayed) Labs Reviewed  URINALYSIS, ROUTINE W REFLEX MICROSCOPIC - Abnormal; Notable for the following components:      Result Value   APPearance HAZY (*)    Hgb urine dipstick SMALL (*)    Bacteria, UA RARE (*)    All other components within normal limits  BASIC METABOLIC PANEL - Abnormal; Notable for the following components:   Glucose, Bld 109 (*)    All other components within normal limits  CBC - Abnormal; Notable for the following components:   Hemoglobin 11.9 (*)    All other components within normal limits  URINE CULTURE  I-STAT BETA HCG BLOOD, ED ([facility], [facility], [facility] ONLY)    EKG None  Radiology No results found.  Procedures Procedures (including critical care time)  Medications Ordered in ED Medications  ciprofloxacin (CIPRO) tablet 500 mg (has no administration in time range)     Initial Impression / Assessment and Plan / ED Course  I have reviewed the triage vital signs and the nursing notes.  Pertinent labs & imaging results that were available during my care of the patient were reviewed by me and considered in my medical decision making (see chart for details).  Clinical Course as of Dec 10 1512  Fri Dec 11, 2018  1512 Patient with signs and symptoms concerning for mild pyelonephritis.  Labs are reassuring with a normal creatinine and a normal white count.  Urinalysis significant for small amount of hemoglobin, bacteria  and white blood cells.  This is consistent with her UTIs in the past.  Will treat with ciprofloxacin for concern of pyelonephritis.  No concern for kidney stone at this time.  No concern for STD or pelvic problem.  Patient declines the need for any STD testing.  Patient was advised on strict return precautions.   [KM]    Clinical Course User Index [KM] Arlyn DunningMcLean, Zaydenn Balaguer A, PA-C       Based on review of vitals, medical screening exam, lab work and/or imaging, there does not appear to be an acute,  emergent etiology for the patient's symptoms. Counseled pt on good return precautions and encouraged both PCP and ED follow-up as needed.  Prior to discharge, I also discussed incidental imaging findings with patient in detail and advised appropriate, recommended follow-up in detail.  Clinical Impression: 1. Pyelonephritis     Disposition: Discharge  Prior to providing a prescription for a controlled substance, I independently reviewed the patient's recent prescription history on the [place]. The patient had no recent or regular prescriptions and was deemed appropriate for a brief, less than 3 day prescription of narcotic for acute analgesia.  This note was prepared with assistance of Systems analyst. Occasional wrong-word or sound-a-like substitutions may have occurred due to the inherent limitations of voice recognition software.   Final Clinical Impressions(s) / ED Diagnoses   Final diagnoses:  Pyelonephritis    ED Discharge Orders         Ordered    ciprofloxacin (CIPRO) 500 MG tablet  Every 12 hours     12/11/18 1514           Kristine Royal 12/11/18 1514    Virgel Manifold, MD 12/12/18 1216

## 2018-12-13 LAB — URINE CULTURE: Culture: 50000 — AB

## 2018-12-14 ENCOUNTER — Telehealth: Payer: Self-pay | Admitting: *Deleted

## 2018-12-14 NOTE — Telephone Encounter (Signed)
Post ED Visit - Positive Culture Follow-up  Culture report reviewed by antimicrobial stewardship pharmacist: [place] Team []  Elenor Quinones, Pharm.D. []  Heide Guile, Pharm.D., BCPS AQ-ID []  Parks Neptune, Pharm.D., BCPS []  Alycia Rossetti, Pharm.D., BCPS [x]  Princess Anne, Pharm.D., BCPS, AAHIVP []  Legrand Como, Pharm.D., BCPS, AAHIVP []  Salome Arnt, PharmD, BCPS []  Johnnette Gourd, PharmD, BCPS []  Hughes Better, PharmD, BCPS []  Leeroy Cha, PharmD []  Laqueta Linden, PharmD, BCPS []  Albertina Parr, PharmD  [place] Team []  Leodis Sias, PharmD []  Lindell Spar, PharmD []  Royetta Asal, PharmD []  Graylin Shiver, Rph []  Rema Fendt) Glennon Mac, PharmD []  Arlyn Dunning, PharmD []  Netta Cedars, PharmD []  Dia Sitter, PharmD []  Leone Haven, PharmD []  Gretta Arab, PharmD []  Theodis Shove, PharmD []  Peggyann Juba, PharmD []  Reuel Boom, PharmD   Positive urine culture Treated with Ciprofloxacin HCL, organism sensitive to the same and no further patient follow-up is required at this time.  Harlon Flor [facility] 12/14/2018, 11:46 AM

## 2019-02-08 ENCOUNTER — Telehealth: Payer: Medicaid Other | Admitting: Emergency Medicine

## 2019-02-08 DIAGNOSIS — R1031 Right lower quadrant pain: Secondary | ICD-10-CM

## 2019-02-08 DIAGNOSIS — N898 Other specified noninflammatory disorders of vagina: Secondary | ICD-10-CM

## 2019-02-08 DIAGNOSIS — R1032 Left lower quadrant pain: Secondary | ICD-10-CM

## 2019-02-08 NOTE — Progress Notes (Signed)
Ms, Monica Nash,   I'm sorry you are not feeling well.    Unfortunately you reported some symptoms that are concerning including belly pain, low back pain, foul smelling vaginal discharge and itching.  Your symptoms may be from a vaginal or urinary infection and it is hard to treat this appropriately based on a questionnaire.  Some of these symptoms may be related to a simple urinary tract infection but usually a bladder infection does not cause discharge, back pain.    Based on what you shared with me, I feel your condition warrants further evaluation in person and I recommend that you be seen for a face to face visit.  Please contact your primary care physician practice to be seen. You can also seek care at an urgent care or emergency department.   If you do not have a PCP,  offers a free physician referral service available at [phone]. Our trained staff has the experience, knowledge and resources to put you in touch with a physician who is right for you.   You also have the option of a video visit through [email or web address]  If you are having a true medical emergency please call 911.  NOTE: If you entered your credit card information for this eVisit, you will not be charged. You may see a "hold" on your card for the $35 but that hold will drop off and you will not have a charge processed.  Your e-visit answers were reviewed by a board certified advanced clinical practitioner to complete your personal care plan.  Thank you for using e-Visits.

## 2019-02-11 ENCOUNTER — Telehealth: Payer: Medicaid Other

## 2019-11-24 ENCOUNTER — Inpatient Hospital Stay (HOSPITAL_COMMUNITY): Admission: RE | Admit: 2019-11-24 | Payer: Medicaid Other | Source: Ambulatory Visit

## 2020-01-06 ENCOUNTER — Inpatient Hospital Stay (HOSPITAL_COMMUNITY)
Admission: AD | Admit: 2020-01-06 | Discharge: 2020-01-06 | Disposition: A | Discharge status: Home / Self Care | Payer: Medicaid Other | Attending: Obstetrics and Gynecology | Admitting: Obstetrics and Gynecology

## 2020-01-06 ENCOUNTER — Other Ambulatory Visit: Payer: Self-pay

## 2020-01-06 DIAGNOSIS — N912 Amenorrhea, unspecified: Secondary | ICD-10-CM | POA: Diagnosis not present

## 2020-01-06 DIAGNOSIS — Z3200 Encounter for pregnancy test, result unknown: Secondary | ICD-10-CM | POA: Diagnosis present

## 2020-01-06 NOTE — MAU Note (Signed)
PT instruted where to go to have pregnancy confiremed. No acute problems today.

## 2020-01-06 NOTE — MAU Note (Signed)
Pt reports she has not had a period last month took 2 HPT and both where negative. Denies any pain today or vag bleeding or discharge. Stopped Depo in April2020 has had regular periods since.

## 2020-01-06 NOTE — MAU Provider Note (Signed)
Ms.Remee Ragan is a 24 y.o. G2P1011 at Unknown who presents to MAU today for pregnancy verification. The patient denies abdominal pain or vaginal bleeding today. She has had 2 negative home tests.  BP 111/63   Pulse (!) 49   Temp 98.3 F (36.8 C)   Resp 18   Ht 5\' 4"  (1.626 m)   Wt 42.2 kg   LMP 11/05/2019   BMI 15.96 kg/m   CONSTITUTIONAL: Well-developed, well-nourished female in no acute distress.  MUSCULOSKELETAL: Normal range of motion.  CARDIOVASCULAR: Regular heart rate RESPIRATORY: Normal effort NEUROLOGICAL: Alert and oriented to person, place, and time.  SKIN: No pallor. PSYCH: Normal mood and affect. Normal behavior. Normal judgment and thought content.  No results found for this or any previous visit (from the past 24 hour(s)).  MDM Patient advised that without concerning symptoms today UPT will not be performed in MAU at this time  A: Amenorrhea  P: Discharge home Pt advised that routine pregnancy tests are offered in the [facility] M-F 8-4 Patient may return to MAU as needed or if her condition were to change or worsen   10-4, Donette Larry  01/06/2020 5:08 PM

## 2020-01-10 ENCOUNTER — Ambulatory Visit (INDEPENDENT_AMBULATORY_CARE_PROVIDER_SITE_OTHER): Payer: Medicaid Other | Admitting: *Deleted

## 2020-01-10 ENCOUNTER — Other Ambulatory Visit: Payer: Self-pay

## 2020-01-10 DIAGNOSIS — Z3200 Encounter for pregnancy test, result unknown: Secondary | ICD-10-CM | POA: Diagnosis not present

## 2020-01-10 LAB — POCT PREGNANCY, URINE: Preg Test, Ur: NEGATIVE

## 2020-01-10 NOTE — Progress Notes (Signed)
Pt submitted urine for pregnancy testing earlier today. I called pt and informed her of negative pregnancy test today. Pt states LMP 11/05/19. She had gone to MAU on 11/4 for pregnancy test however in the absence of worrisome symptoms, the test was not performed and she was provided with information to come to our office for UPT. I advised pt to contact our office for Gyn appt if she has not received a period by early December. Pt also stated that she is having thick white vaginal discharge and irritation. She requested to have her specimen tested for yeast. I explained that we cannot determine a yeast infection from urine specimen. Since pt has classic symptoms, I advised she try OTC Monistat. Pt stated that she had tried the one Rangel Echeverri treatment of the medication and it only relieved the irritation but not the discharge. I advised that she obtain the 7-Alf Doyle treatment of Monistat. Pt voiced understanding.

## 2020-01-10 NOTE — Progress Notes (Signed)
Attestation of Attending Supervision of clinical support staff: I agree with the care provided to this patient and was available for any consultation.  I have reviewed the RN's note and chart. I was available for consult and to see the patient if needed.   Mica Releford Niles Orine Goga, MD, MPH, ABFM Attending Physician Faculty Practice- [facility]  

## 2020-01-10 NOTE — Progress Notes (Signed)
.  knatt

## 2020-02-08 ENCOUNTER — Ambulatory Visit: Payer: Medicaid Other

## 2020-02-22 DIAGNOSIS — Z20822 Contact with and (suspected) exposure to covid-19: Secondary | ICD-10-CM | POA: Diagnosis not present

## 2020-02-28 IMAGING — RF DG CHOLANGIOGRAM OPERATIVE
1 series · 8 of 8 positions shown · non-contrast
Comparison: Right upper quadrant abdominal ultrasound - 11/11/2017

CLINICAL DATA: Intraoperative cholangiogram during laparoscopic
cholecystectomy.

EXAM:
INTRAOPERATIVE CHOLANGIOGRAM
FLUOROSCOPY TIME:  16 seconds

[Series 1: run · 2 acquisitions, 8 frames shown]
[im 1/2]
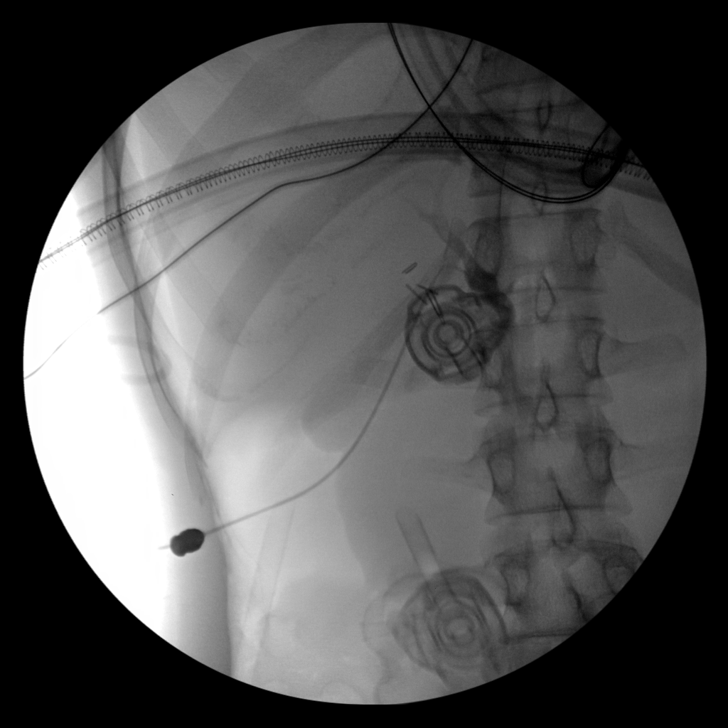
[im 1/2]
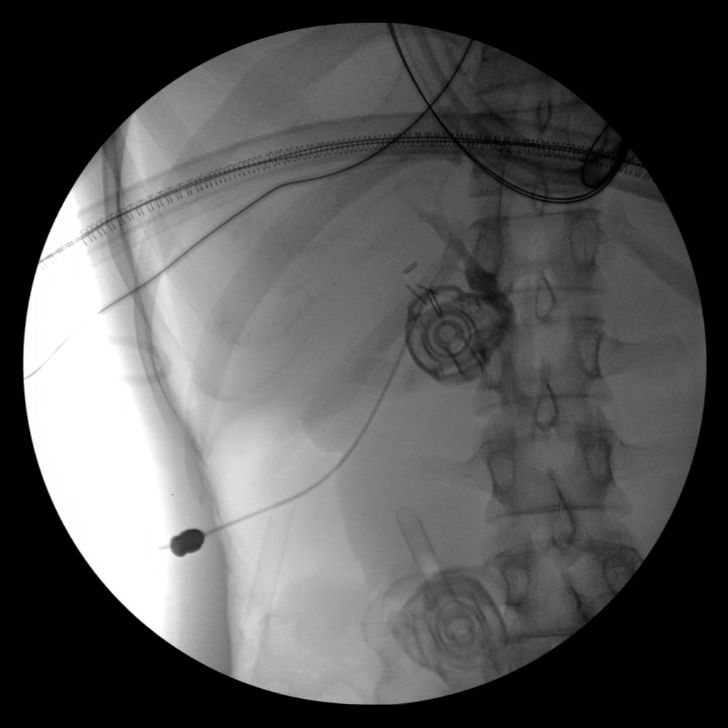
[im 1/2]
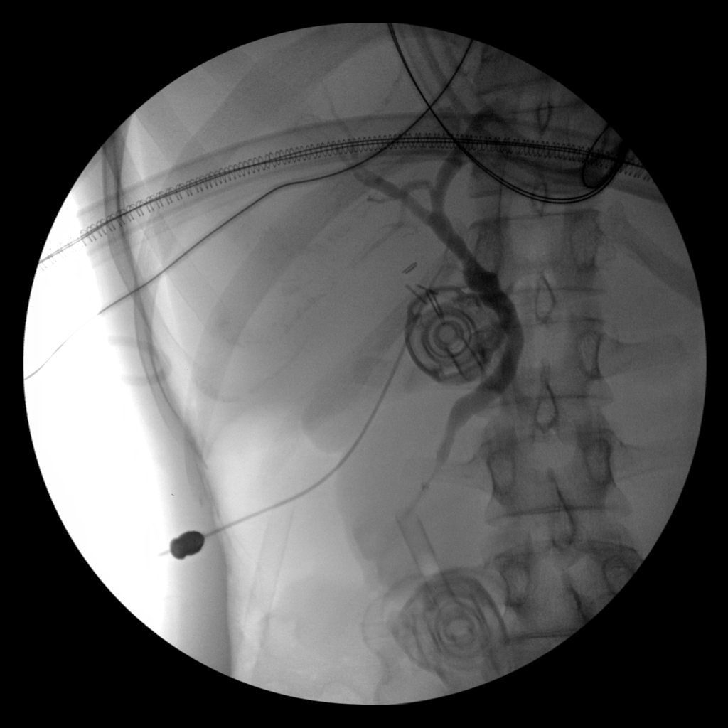
[im 1/2]
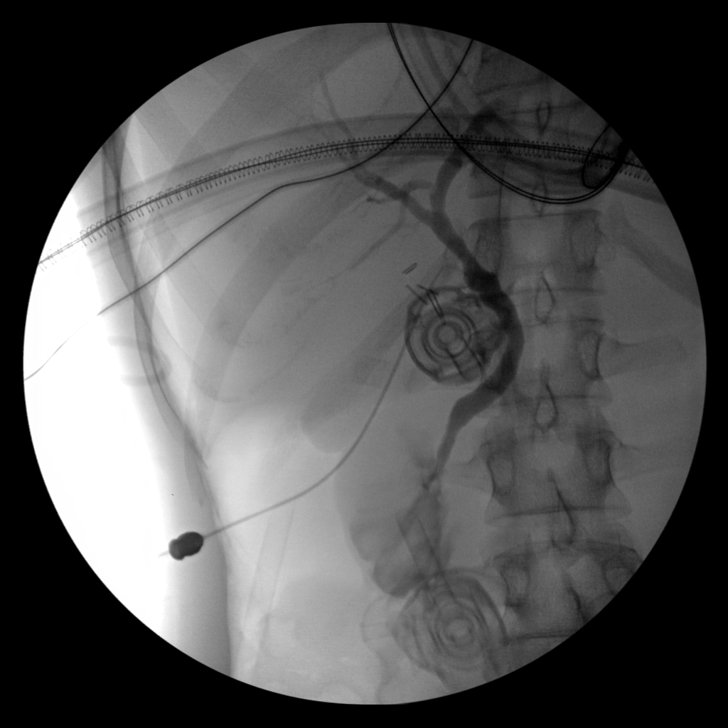
[im 2/2]
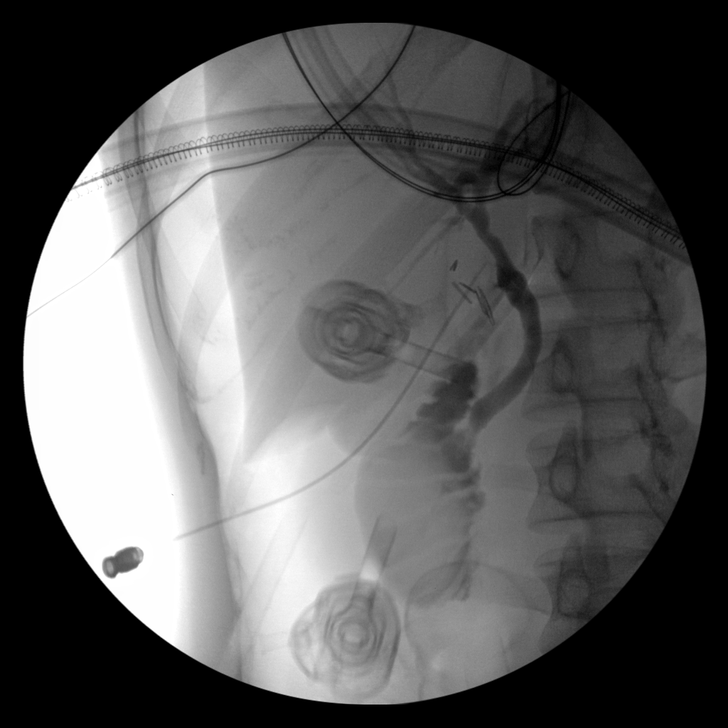
[im 2/2]
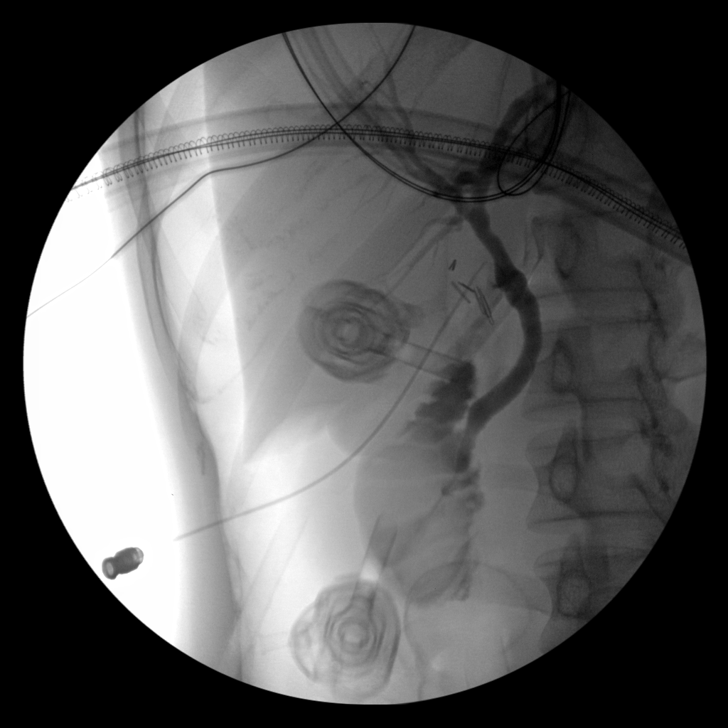
[im 2/2]
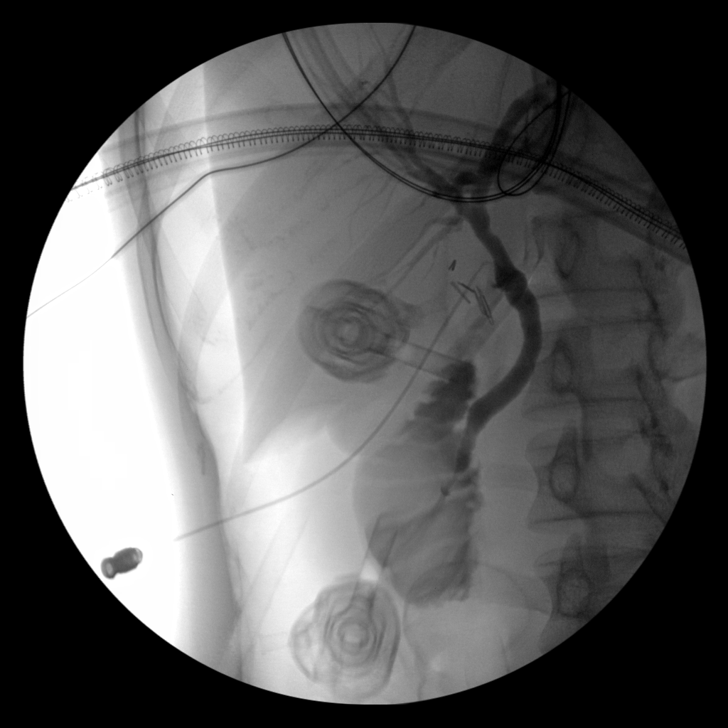
[im 2/2]
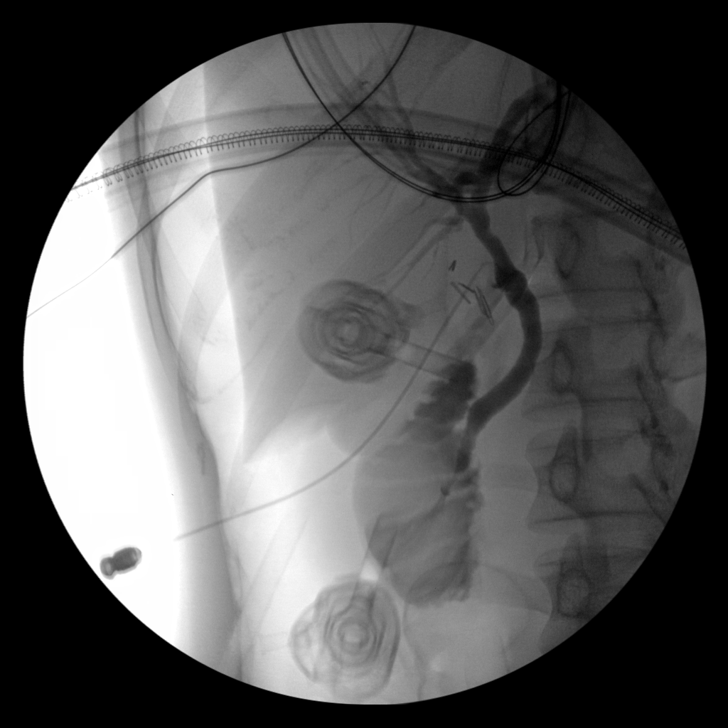

[8 of 8 positions shown; findings below may reference images not displayed]

FINDINGS: Intraoperative cholangiographic images of the right upper abdominal
quadrant during laparoscopic cholecystectomy are provided for
review.

Surgical clips overlie the expected location of the gallbladder
fossa.

Contrast injection demonstrates selective cannulation of the central
aspect of the cystic duct.

There is passage of contrast through the central aspect of the
cystic duct with filling of a mildly dilated common bile duct. There
is passage of contrast though the CBD and into the descending
portion of the duodenum.

There is minimal reflux of injected contrast into the common hepatic
duct and central aspect of the non dilated intrahepatic biliary
system.

There are no discrete filling defects within the opacified portions
of the biliary system to suggest the presence of
choledocholithiasis.
IMPRESSION: No evidence of choledocholithiasis.

## 2020-04-03 ENCOUNTER — Telehealth: Payer: Self-pay | Admitting: *Deleted

## 2020-04-03 NOTE — Telephone Encounter (Signed)
Error, wrong chart opened. Please disregard encounter.

## 2020-04-23 ENCOUNTER — Other Ambulatory Visit: Payer: Self-pay

## 2020-04-23 ENCOUNTER — Ambulatory Visit (INDEPENDENT_AMBULATORY_CARE_PROVIDER_SITE_OTHER): Payer: Medicaid Other

## 2020-04-23 ENCOUNTER — Ambulatory Visit
Admission: EM | Admit: 2020-04-23 | Discharge: 2020-04-23 | Disposition: A | Discharge status: Home / Self Care | Payer: Medicaid Other | Attending: Emergency Medicine | Admitting: Emergency Medicine

## 2020-04-23 ENCOUNTER — Encounter: Payer: Self-pay | Admitting: Emergency Medicine

## 2020-04-23 DIAGNOSIS — M79642 Pain in left hand: Secondary | ICD-10-CM | POA: Diagnosis not present

## 2020-04-23 DIAGNOSIS — S60222A Contusion of left hand, initial encounter: Secondary | ICD-10-CM | POA: Diagnosis not present

## 2020-04-23 MED ORDER — IBUPROFEN 800 MG PO TABS: 800.0000 mg | ORAL_TABLET | Freq: Three times a day (TID) | ORAL | 0 refills | Status: DC

## 2020-04-23 NOTE — ED Provider Notes (Signed)
[name]    [name]: [id number] Arrival date & time: 04/23/20  1431      History   Chief Complaint Chief Complaint  Patient presents with  . Hand Pain    HPI Monica Nash is a 25 y.o. female presenting today for evaluation of left hand injury.  Reports she was cleaning her car when a rush of wind came and caused the car door to slam on her left hand.  Sustained scabbing and bruising to the dorsum of her hand.  Incident occurred 2 days ago.  She reports pain with bending her fingers and pain mainly around her knuckles.  Denies prior injury to hand.  HPI  Past Medical History:  Diagnosis Date  . Anemia   . UTI (urinary tract infection)     Patient Active Problem List   Diagnosis Date Noted  . Choledocholithiasis 11/11/2017  . Elevated liver enzymes 11/11/2017  . Nausea and vomiting 11/11/2017  . Hypokalemia 11/11/2017  . Chest pain 11/10/2017  . SOB (shortness of breath) 11/10/2017  . S/P primary low transverse C-section 01/27/2015    Past Surgical History:  Procedure Laterality Date  . CESAREAN SECTION N/A 01/27/2015   Procedure: CESAREAN SECTION;  Surgeon: Levie Heritage, DO;  Location: [facility];  Service: Obstetrics;  Laterality: N/A;  . CHOLECYSTECTOMY N/A 11/11/2017   Procedure: LAPAROSCOPIC CHOLECYSTECTOMY WITH INTRAOPERATIVE CHOLANGIOGRAM;  Surgeon: Romie Levee, MD;  Location: [facility];  Service: General;  Laterality: N/A;    OB History    Gravida  2   Para  1   Term  1   Preterm      AB  1   Living  1     SAB  1   IAB      Ectopic      Multiple  0   Live Births  1            Home Medications    Prior to Admission medications   Medication Sig Start Date End Date Taking? Authorizing Provider  ibuprofen (ADVIL) 800 MG tablet Take 1 tablet (800 mg total) by mouth 3 (three) times daily. 04/23/20  Yes Taraji Mungo, Junius Creamer, PA-C    Family History History reviewed. No pertinent family history.  Social History Social History    Tobacco Use  . Smoking status: Never Smoker  . Smokeless tobacco: Never Used  Substance Use Topics  . Alcohol use: No  . Drug use: Yes    Types: Marijuana     Allergies   Patient has no known allergies.   Review of Systems Review of Systems  Constitutional: Negative for fatigue and fever.  HENT: Negative for mouth sores.   Eyes: Negative for visual disturbance.  Respiratory: Negative for shortness of breath.   Cardiovascular: Negative for chest pain.  Gastrointestinal: Negative for abdominal pain, nausea and vomiting.  Genitourinary: Negative for genital sores.  Musculoskeletal: Positive for arthralgias and joint swelling.  Skin: Positive for color change and wound. Negative for rash.  Neurological: Negative for dizziness, weakness, light-headedness and headaches.     Physical Exam Triage Vital Signs ED Triage Vitals [04/23/20 1447]  Enc Vitals Group     BP 112/68     Pulse Rate 70     Resp 18     Temp 98.8 F (37.1 C)     Temp Source Oral     SpO2 99 %     Weight      Height  Head Circumference      Peak Flow      Pain Score 6     Pain Loc      Pain Edu?      Excl. in GC?    No data found.  Updated Vital Signs BP 112/68 (BP Location: Left Arm)   Pulse 70   Temp 98.8 F (37.1 C) (Oral)   Resp 18   SpO2 99%   Visual Acuity Right Eye Distance:   Left Eye Distance:   Bilateral Distance:    Right Eye Near:   Left Eye Near:    Bilateral Near:     Physical Exam Vitals and nursing note reviewed.  Constitutional:      Appearance: She is well-developed and well-nourished.     Comments: No acute distress  HENT:     Head: Normocephalic and atraumatic.     Nose: Nose normal.  Eyes:     Conjunctiva/sclera: Conjunctivae normal.  Cardiovascular:     Rate and Rhythm: Normal rate.  Pulmonary:     Effort: Pulmonary effort is normal. No respiratory distress.  Abdominal:     General: There is no distension.  Musculoskeletal:        General:  Normal range of motion.     Cervical back: Neck supple.     Comments: Left hand: Scabbing noted to distal dorsum of hand with slight surrounding erythema, tender to palpation around this area and along second through fourth MCP joints, full active range of motion of all 5 fingers at DIP PIP and MCP joint although does elicit pain Nontender to distal radius and ulna, radial pulse 2+  Skin:    General: Skin is warm and dry.  Neurological:     Mental Status: She is alert and oriented to person, place, and time.  Psychiatric:        Mood and Affect: Mood and affect normal.      UC Treatments / Results  Labs (all labs ordered are listed, but only abnormal results are displayed) Labs Reviewed - No data to display  EKG   Radiology DG Hand Complete Left  Result Date: 04/23/2020 CLINICAL DATA:  Left hand pain. EXAM: LEFT HAND - COMPLETE 3+ VIEW COMPARISON:  None. FINDINGS: There is no evidence of fracture or dislocation. There is no evidence of arthropathy or other focal bone abnormality. Soft tissues are unremarkable. IMPRESSION: Negative. Electronically Signed   By: Kennith Center M.D.   On: 04/23/2020 15:04    Procedures Procedures (including critical care time)  Medications Ordered in UC Medications - No data to display  Initial Impression / Assessment and Plan / UC Course  I have reviewed the triage vital signs and the nursing notes.  Pertinent labs & imaging results that were available during my care of the patient were reviewed by me and considered in my medical decision making (see chart for details).     X-ray negative for acute bony abnormality, likely hand contusion, treating with Ace wrap, anti-inflammatories ice and elevation.  Monitor for gradual resolution.  Discussed strict return precautions. Patient verbalized understanding and is agreeable with plan.  Final Clinical Impressions(s) / UC Diagnoses   Final diagnoses:  Contusion of left hand, initial encounter    Discharge Instructions   None    ED Prescriptions    Medication Sig Dispense Auth. Provider   ibuprofen (ADVIL) 800 MG tablet Take 1 tablet (800 mg total) by mouth 3 (three) times daily. 21 tablet Finneas Mathe, Lincoln C, PA-C  PDMP not reviewed this encounter.   Lew Dawes, [place] 04/23/20 1511

## 2020-04-23 NOTE — ED Triage Notes (Signed)
Pt here for left hand pain after slamming in car door yesterday; swelling noted around knuckle of middle finger

## 2020-11-08 ENCOUNTER — Inpatient Hospital Stay (HOSPITAL_COMMUNITY)
Admission: AD | Admit: 2020-11-08 | Discharge: 2020-11-08 | Disposition: A | Discharge status: Home / Self Care | Payer: Medicaid Other | Attending: Obstetrics and Gynecology | Admitting: Obstetrics and Gynecology

## 2020-11-08 ENCOUNTER — Other Ambulatory Visit: Payer: Self-pay

## 2020-11-08 ENCOUNTER — Ambulatory Visit (INDEPENDENT_AMBULATORY_CARE_PROVIDER_SITE_OTHER): Payer: Medicaid Other

## 2020-11-08 DIAGNOSIS — Z3687 Encounter for antenatal screening for uncertain dates: Secondary | ICD-10-CM

## 2020-11-08 DIAGNOSIS — Z3201 Encounter for pregnancy test, result positive: Secondary | ICD-10-CM | POA: Insufficient documentation

## 2020-11-08 NOTE — Patient Instructions (Signed)

## 2020-11-08 NOTE — Progress Notes (Signed)
Pt left urine for walk in pregnancy test resulting positive.  Pt denies vaginal bleeding or pain.  Pt reports that she is unsure of her period but she remembers that came on sometime in July.  Dating U/S scheduled for September 15th @ 0800.  Pt notified.  Medications/allergies reviewed.  Pt advised to contact a OB provider to start ob care.    Leonette Nutting  11/08/20

## 2020-11-08 NOTE — Discharge Instructions (Signed)
Center for [facility] [address] [place], [place]

## 2020-11-08 NOTE — MAU Provider Note (Signed)
Patient here for pregnancy confirmation. She has no complaints. Patient was given address for [facility] and will go there at 1 for pregnancy test.   Monica Nash

## 2020-11-08 NOTE — Progress Notes (Signed)
Chart reviewed for nurse visit. Agree with plan of care.   Venora Maples, MD 11/08/20 3:06 PM

## 2020-11-08 NOTE — MAU Note (Signed)
Presents stating she wants a pregnancy confirmation.  Has had 3 +HPT.  Denies pain and VB.  LMP, unsure.

## 2020-11-09 LAB — POCT PREGNANCY, URINE: Preg Test, Ur: POSITIVE — AB

## 2020-11-12 ENCOUNTER — Other Ambulatory Visit: Payer: Self-pay

## 2020-11-12 ENCOUNTER — Inpatient Hospital Stay (HOSPITAL_COMMUNITY)
Admission: AD | Admit: 2020-11-12 | Discharge: 2020-11-12 | Disposition: A | Discharge status: Home / Self Care | Payer: Medicaid Other | Attending: Family Medicine | Admitting: Family Medicine

## 2020-11-12 ENCOUNTER — Inpatient Hospital Stay (HOSPITAL_COMMUNITY): Payer: Medicaid Other

## 2020-11-12 ENCOUNTER — Encounter (HOSPITAL_COMMUNITY): Payer: Self-pay | Admitting: Emergency Medicine

## 2020-11-12 DIAGNOSIS — R109 Unspecified abdominal pain: Secondary | ICD-10-CM | POA: Insufficient documentation

## 2020-11-12 DIAGNOSIS — O3680X0 Pregnancy with inconclusive fetal viability, not applicable or unspecified: Secondary | ICD-10-CM

## 2020-11-12 DIAGNOSIS — Z3A00 Weeks of gestation of pregnancy not specified: Secondary | ICD-10-CM | POA: Diagnosis not present

## 2020-11-12 DIAGNOSIS — O26899 Other specified pregnancy related conditions, unspecified trimester: Secondary | ICD-10-CM

## 2020-11-12 DIAGNOSIS — N83201 Unspecified ovarian cyst, right side: Secondary | ICD-10-CM | POA: Diagnosis not present

## 2020-11-12 DIAGNOSIS — R1030 Lower abdominal pain, unspecified: Secondary | ICD-10-CM | POA: Diagnosis not present

## 2020-11-12 DIAGNOSIS — O209 Hemorrhage in early pregnancy, unspecified: Secondary | ICD-10-CM

## 2020-11-12 DIAGNOSIS — O0289 Other abnormal products of conception: Secondary | ICD-10-CM | POA: Diagnosis not present

## 2020-11-12 DIAGNOSIS — N939 Abnormal uterine and vaginal bleeding, unspecified: Secondary | ICD-10-CM | POA: Diagnosis not present

## 2020-11-12 LAB — URINALYSIS, MICROSCOPIC (REFLEX): RBC / HPF: >50 RBC/hpf (ref 0–5)

## 2020-11-12 LAB — URINALYSIS, ROUTINE W REFLEX MICROSCOPIC
Bilirubin Urine: NEGATIVE
Glucose, UA: NEGATIVE mg/dL
Ketones, ur: 40 mg/dL — AB
Leukocytes,Ua: NEGATIVE
Nitrite: NEGATIVE
Protein, ur: NEGATIVE mg/dL
Specific Gravity, Urine: 1.02 (ref 1.005–1.030)
pH: 7 (ref 5.0–8.0)

## 2020-11-12 LAB — CBC
HCT: 37.4 % (ref 36.0–46.0)
Hemoglobin: 12.3 g/dL (ref 12.0–15.0)
MCH: 30.3 pg (ref 26.0–34.0)
MCHC: 32.9 g/dL (ref 30.0–36.0)
MCV: 92.1 fL (ref 80.0–100.0)
Platelets: 220 10*3/uL (ref 150–400)
RBC: 4.06 MIL/uL (ref 3.87–5.11)
RDW: 13.8 % (ref 11.5–15.5)
WBC: 8 10*3/uL (ref 4.0–10.5)
nRBC: 0 % (ref 0.0–0.2)

## 2020-11-12 LAB — WET PREP, GENITAL
Clue Cells Wet Prep HPF POC: NONE SEEN
Sperm: NONE SEEN
Trich, Wet Prep: NONE SEEN
Yeast Wet Prep HPF POC: NONE SEEN

## 2020-11-12 LAB — HCG, QUANTITATIVE, PREGNANCY: hCG, Beta Chain, Quant, S: 549 m[IU]/mL — ABNORMAL HIGH (ref <5)

## 2020-11-12 NOTE — ED Provider Notes (Signed)
Emergency Medicine Provider Triage Evaluation Note  Monica Nash , a 25 y.o. female  was evaluated in triage.  Pt complains of lower abdominal cramping, vaginal bleeding since this morning.  Unaware when last menstrual cycle was, however did test positive for pregnancy last week.  G2, P1.  Review of Systems  Positive: Vaginal bleeding, lower abdominal cramping Negative: Fevers, chills, syncope  Physical Exam  BP 123/86 (BP Location: Left Arm)   Pulse 66   Temp 98.5 F (36.9 C) (Oral)   Resp 18   LMP  (LMP Unknown)   SpO2 100%  Gen:   Awake, no distress   Resp:  Normal effort  MSK:   Moves extremities without difficulty  Other:  Bilateral lower abdominal tenderness palpation,R>L.  Medical Decision Making  Medically screening exam initiated at 11:29 AM.  Appropriate orders placed.  Monica Nash was informed that the remainder of the evaluation will be completed by another provider, this initial triage assessment does not replace that evaluation, and the importance of remaining in the ED until their evaluation is complete.  Case discussed with MAU APP Monica Nash, who is agreeable to seeing this patient in her department.  Appreciate the collaboration of care this patient.  This chart was dictated using voice recognition software, Dragon. Despite the best efforts of this provider to proofread and correct errors, errors may still occur which can change documentation meaning.    Monica Nash 11/12/20 1203    Maia Plan, MD 11/19/20 1724

## 2020-11-12 NOTE — ED Triage Notes (Signed)
Pt reports lower abd cramping and vaginal bleeding since this morning.  States she is pregnant but not sure how far along she is or LMP.  G2L1

## 2020-11-12 NOTE — ED Notes (Signed)
Report called to Felicia in MAU °

## 2020-11-12 NOTE — MAU Note (Signed)
Presents stating she's having lower abdominal cramping and VB that began this morning.  Reports currently wearing sanitary napkin, but not saturating through pad.  Unsure LMP.

## 2020-11-12 NOTE — MAU Provider Note (Signed)
Chief Complaint:  Vaginal Bleeding and Abdominal Pain   Event Date/Time   First Provider Initiated Contact with Patient 11/12/20 1309     HPI: Monica Nash is a 25 y.o. G3P1011 at Unknown who presents to maternity admissions reporting vaginal bleeding and abdominal cramping. Patient is unsure of LMP and had a positive pregnancy test at [facility] on 11/08/20. Patient reports this morning she started having vaginal bleeding and lower abdominal cramping. She is wearing a pad, but is wearing the same one that she put on this morning. She reports that she and her significant other did have intercourse yesterday. She denies vaginal discharge, itching, odor, fever, or urinary s/s.    Pregnancy Course:   Past Medical History:  Diagnosis Date   Anemia    UTI (urinary tract infection)    OB History  Gravida Para Term Preterm AB Living  3 1 1   1 1   SAB IAB Ectopic Multiple Live Births  1     0 1    # Outcome Date GA Lbr Len/2nd Weight Sex Delivery Anes PTL Lv  3 Current           2 Term 01/27/15 [redacted]w[redacted]d  3410 g M CS-LTranv Spinal  LIV  1 SAB 2013           Past Surgical History:  Procedure Laterality Date   CESAREAN SECTION N/A 01/27/2015   Procedure: CESAREAN SECTION;  Surgeon: 01/29/2015, DO;  Location: [facility];  Service: Obstetrics;  Laterality: N/A;   CHOLECYSTECTOMY N/A 11/11/2017   Procedure: LAPAROSCOPIC CHOLECYSTECTOMY WITH INTRAOPERATIVE CHOLANGIOGRAM;  Surgeon: 01/11/2018, MD;  Location: [facility];  Service: General;  Laterality: N/A;   Family History  Problem Relation Age of Onset   Cancer Sister    Social History   Tobacco Use   Smoking status: Never   Smokeless tobacco: Never  Vaping Use   Vaping Use: Former  Substance Use Topics   Alcohol use: No   Drug use: Not Currently    Types: Marijuana   No Known Allergies No medications prior to admission.   I have reviewed patient's Past Medical Hx, Surgical Hx, Family Hx, Social Hx, medications and allergies.   ROS:   Review of Systems  Constitutional: Negative.   Respiratory: Negative.    Cardiovascular: Negative.   Gastrointestinal:  Positive for abdominal pain. Negative for nausea and vomiting.  Genitourinary:  Positive for vaginal bleeding. Negative for dysuria and vaginal discharge.  Musculoskeletal: Negative.   Neurological: Negative.    Physical Exam  Patient Vitals for the past 24 hrs:  BP Temp Temp src Pulse Resp SpO2 Height Weight  11/12/20 1509 121/70 -- -- 71 -- -- -- --  11/12/20 1300 124/67 -- -- (!) 57 -- -- -- --  11/12/20 1236 121/61 98 F (36.7 C) Oral 62 19 100 % 5\' 4"  (1.626 m) 44 kg  11/12/20 1120 123/86 98.5 F (36.9 C) Oral 66 18 100 % -- --   Constitutional: well-developed, well-nourished female in no acute distress.  Cardiovascular: normal rate Respiratory: normal effort GI: abd soft, non-tender MS: extremities nontender, no edema, normal ROM Neurologic: alert and oriented x 4.  Pelvic: NEFG, small amount of bright red bleeding, cervix appears slightly open with ?clot vs tissue protruding through os which was gently removed with ring forcep, cervix without lesions/masses    Labs: Results for orders placed or performed during the hospital encounter of 11/12/20 (from the past 24 hour(s))  Wet prep, genital  Status: Abnormal   Collection Time: 11/12/20 12:38 PM   Specimen: Urine, Clean Catch  Result Value Ref Range   Yeast Wet Prep HPF POC NONE SEEN NONE SEEN   Trich, Wet Prep NONE SEEN NONE SEEN   Clue Cells Wet Prep HPF POC NONE SEEN NONE SEEN   WBC, Wet Prep HPF POC MODERATE (A) NONE SEEN   Sperm NONE SEEN   Urinalysis, Routine w reflex microscopic Urine, Clean Catch     Status: Abnormal   Collection Time: 11/12/20 12:43 PM  Result Value Ref Range   Color, Urine RED (A) YELLOW   APPearance HAZY (A) CLEAR   Specific Gravity, Urine 1.020 1.005 - 1.030   pH 7.0 5.0 - 8.0   Glucose, UA NEGATIVE NEGATIVE mg/dL   Hgb urine dipstick LARGE (A) NEGATIVE    Bilirubin Urine NEGATIVE NEGATIVE   Ketones, ur 40 (A) NEGATIVE mg/dL   Protein, ur NEGATIVE NEGATIVE mg/dL   Nitrite NEGATIVE NEGATIVE   Leukocytes,Ua NEGATIVE NEGATIVE  Urinalysis, Microscopic (reflex)     Status: Abnormal   Collection Time: 11/12/20 12:43 PM  Result Value Ref Range   RBC / HPF >50 0 - 5 RBC/hpf   WBC, UA 11-20 0 - 5 WBC/hpf   Bacteria, UA RARE (A) NONE SEEN   Squamous Epithelial / LPF 6-10 0 - 5   Mucus PRESENT   CBC     Status: None   Collection Time: 11/12/20 12:53 PM  Result Value Ref Range   WBC 8.0 4.0 - 10.5 K/uL   RBC 4.06 3.87 - 5.11 MIL/uL   Hemoglobin 12.3 12.0 - 15.0 g/dL   HCT 85.2 77.8 - 24.2 %   MCV 92.1 80.0 - 100.0 fL   MCH 30.3 26.0 - 34.0 pg   MCHC 32.9 30.0 - 36.0 g/dL   RDW 35.3 61.4 - 43.1 %   Platelets 220 150 - 400 K/uL   nRBC 0.0 0.0 - 0.2 %  hCG, quantitative, pregnancy     Status: Abnormal   Collection Time: 11/12/20 12:53 PM  Result Value Ref Range   hCG, Beta Chain, Quant, S 549 (H) <5 mIU/mL    Imaging:  US OB LESS THAN 14 WEEKS WITH OB TRANSVAGINAL  Result Date: 11/12/2020 CLINICAL DATA:  Vaginal bleeding and pain.  Unknown last menstrual. EXAM: OBSTETRIC <14 WK [place] AND TRANSVAGINAL OB US TECHNIQUE: Both transabdominal and transvaginal ultrasound examinations were performed for complete evaluation of the gestation as well as the maternal uterus, adnexal regions, and pelvic cul-de-sac. Transvaginal technique was performed to assess early pregnancy. COMPARISON:  None. FINDINGS: Intrauterine gestational sac: None Yolk sac:  Not Visualized. Embryo:  Not Visualized. The uterus has normal appearance with endometrial thickness of 5 mm. Maternal uterus/adnexae: Normal appearance of the ovaries. Corpus luteal cyst is seen on the right ovary measuring 1.9 cm. IMPRESSION: No evidence of intrauterine pregnancy. Normal appearance of the ovaries. Electronically Signed   By: Ted Mcalpine M.D.   On: 11/12/2020 14:44    MAU Course: Orders  Placed This Encounter  Procedures   Wet prep, genital   US OB LESS THAN 14 WEEKS WITH OB TRANSVAGINAL   CBC   hCG, quantitative, pregnancy   Urinalysis, Routine w reflex microscopic Urine, Clean Catch   Urinalysis, Microscopic (reflex)   Discharge patient   No orders of the defined types were placed in this encounter.   MDM: CBC unremarkable, HCG 549 Blood type A Pos, not a candidate for Rhophylac Specimen that was  collected during pelvic exam sent for pathology for ? POC Wet prep, GC/CT collected [place] with results as above    Assessment: 1. Pregnancy, location unknown   2. Abdominal pain affecting pregnancy   3. Vaginal bleeding affecting early pregnancy      Plan: Discharge home in stable condition Patient to follow up at [facility] on Tuesday 9/13 for repeat bHCG Strict return precautions provided Return to MAU as needed      Follow-up Information     Center for [facility] Follow up.   Specialty: Obstetrics and Gynecology Why: on Tuesday 9/13 at 10:00am for repeat bhcg. Return to MAU as needed. Contact information: [address] [place] [address] [phone]                Allergies as of 11/12/2020   No Known Allergies      Medication List     TAKE these medications    PrePLUS 27-1 MG Tabs Take 1 tablet by mouth daily.         Camelia Eng, MSN, CNM 11/12/2020 3:22 PM

## 2020-11-13 LAB — GC/CHLAMYDIA PROBE AMP (~~LOC~~) NOT AT ARMC
Chlamydia: NEGATIVE
Comment: NEGATIVE
Comment: NORMAL
Neisseria Gonorrhea: NEGATIVE

## 2020-11-14 ENCOUNTER — Other Ambulatory Visit: Payer: Self-pay

## 2020-11-14 ENCOUNTER — Ambulatory Visit: Payer: Medicaid Other

## 2020-11-14 DIAGNOSIS — O209 Hemorrhage in early pregnancy, unspecified: Secondary | ICD-10-CM

## 2020-11-14 LAB — SURGICAL PATHOLOGY

## 2020-11-14 NOTE — Progress Notes (Signed)
Pt not seen as she just received a non stat beta for evaluation of possible POC.   Reviewed chart with Camelia Eng, CNM.  Provider agreed that pt would just need non stat beta for follow up.  Lab drawn by lab tech and sent home.    Addison Naegeli, RN  11/14/20

## 2020-11-15 ENCOUNTER — Telehealth: Payer: Self-pay | Admitting: Family Medicine

## 2020-11-15 LAB — BETA HCG QUANT (REF LAB): hCG Quant: 64 m[IU]/mL

## 2020-11-15 NOTE — Telephone Encounter (Addendum)
Attempted to contact patient to let her know hcg has fallen significantly, in conjunction with [place] three days prior with no findings this is consistent with missed AB. Went straight to voice mail, I did not leave a message.   Clinical staff please attempt to contact patient again and let her know these results, she will need to have follow up hcg next week and trend it to 0.

## 2020-11-16 ENCOUNTER — Ambulatory Visit: Admission: RE | Admit: 2020-11-16 | Payer: Medicaid Other | Source: Ambulatory Visit

## 2020-11-16 NOTE — Telephone Encounter (Signed)
Call placed to pt. Pt given results and recommendations per Dr Crissie Reese. Pt verbalized understanding and agreeable to plan of care.  Pt scheduled for lab appt on 9/21 at 930 am for non-stat beta. Pt denies any vaginal bleeding or pain at this time. Heavy bleeding precautions given.   Judeth Cornfield, RN

## 2020-11-22 ENCOUNTER — Other Ambulatory Visit: Payer: Self-pay

## 2020-11-22 ENCOUNTER — Other Ambulatory Visit: Payer: Medicaid Other

## 2020-11-22 DIAGNOSIS — O021 Missed abortion: Secondary | ICD-10-CM

## 2020-11-26 ENCOUNTER — Encounter: Payer: Self-pay | Admitting: Emergency Medicine

## 2020-11-26 ENCOUNTER — Other Ambulatory Visit: Payer: Self-pay

## 2020-11-26 ENCOUNTER — Ambulatory Visit
Admission: EM | Admit: 2020-11-26 | Discharge: 2020-11-26 | Disposition: A | Discharge status: Home / Self Care | Payer: Medicaid Other | Attending: Internal Medicine | Admitting: Internal Medicine

## 2020-11-26 DIAGNOSIS — R1031 Right lower quadrant pain: Secondary | ICD-10-CM | POA: Diagnosis not present

## 2020-11-26 LAB — POCT URINALYSIS DIP (MANUAL ENTRY)
Bilirubin, UA: NEGATIVE
Glucose, UA: NEGATIVE mg/dL
Ketones, POC UA: NEGATIVE mg/dL
Leukocytes, UA: NEGATIVE
Nitrite, UA: NEGATIVE
Spec Grav, UA: 1.02 (ref 1.010–1.025)
Urobilinogen, UA: 0.2 E.U./dL
pH, UA: 7 (ref 5.0–8.0)

## 2020-11-26 NOTE — ED Triage Notes (Signed)
Right sided abdominal and flank pain x 3 days. Reports vomiting and diarrhea, denies change in urine. Hx of frequent UTIs and UTIs requiring admission, as well as a cholecystectomy. Miscarriage on 11/12/2020, states they checked her urine for infection at that time and was negative.

## 2020-11-26 NOTE — ED Provider Notes (Signed)
[name]    [name]: [id number] Arrival date & time: 11/26/20  0944      History   Chief Complaint Chief Complaint  Patient presents with   Abdominal Pain    HPI Monica Nash is a 25 y.o. female.   Patient presents with right lower abdominal pain that has been present for approximately 3 days.  Patient describes pain as cramping and states that pain is constant.  Pain is rated 8/10 on the pain scale.  Is also been having some nausea but denies vomiting.  Denies diarrhea.  Last bowel movement was 2 days ago.  Denies any known fevers or sick contacts.  Patient does have history of chronic UTIs but denies urinary burning, urinary frequency, vaginal discharge, hematuria, pelvic pain, back pain.  Denies any chest pain or shortness of breath.  Patient was recently seen at the emergency room a few weeks prior due to irregular vaginal bleeding during pregnancy.  Patient reports that she had a miscarriage but did not have to have a D&C performed.  Patient was to follow-up with outpatient obstetrics after discharge.  Patient reports that follow-up appointment showed a trending down of beta hCG.  Patient denies any current irregular vaginal bleeding.  Last known menstrual cycle is unknown.  Patient reports that she had a regular menstrual cycles prior to pregnancy as well.  Patient is not sure if she has had her gallbladder removed, but thinks that she had some gallstones removed in the past.  Patient also reports history of appendectomy.   Abdominal Pain  Past Medical History:  Diagnosis Date   Anemia    UTI (urinary tract infection)     Patient Active Problem List   Diagnosis Date Noted   Choledocholithiasis 11/11/2017   Elevated liver enzymes 11/11/2017   Nausea and vomiting 11/11/2017   Hypokalemia 11/11/2017   Chest pain 11/10/2017   SOB (shortness of breath) 11/10/2017   S/P primary low transverse C-section 01/27/2015    Past Surgical History:  Procedure Laterality  Date   CESAREAN SECTION N/A 01/27/2015   Procedure: CESAREAN SECTION;  Surgeon: Levie Heritage, DO;  Location: [facility];  Service: Obstetrics;  Laterality: N/A;   CHOLECYSTECTOMY N/A 11/11/2017   Procedure: LAPAROSCOPIC CHOLECYSTECTOMY WITH INTRAOPERATIVE CHOLANGIOGRAM;  Surgeon: Romie Levee, MD;  Location: [facility];  Service: General;  Laterality: N/A;    OB History     Gravida  3   Para  1   Term  1   Preterm      AB  1   Living  1      SAB  1   IAB      Ectopic      Multiple  0   Live Births  1            Home Medications    Prior to Admission medications   Medication Sig Start Date End Date Taking? Authorizing Provider  Prenatal Vit-Fe Fumarate-FA (PREPLUS) 27-1 MG TABS Take 1 tablet by mouth daily.    [provider]    Family History Family History  Problem Relation Age of Onset   Cancer Sister     Social History Social History   Tobacco Use   Smoking status: Never   Smokeless tobacco: Never  Vaping Use   Vaping Use: Former  Substance Use Topics   Alcohol use: No   Drug use: Not Currently    Types: Marijuana     Allergies   Patient has no  known allergies.   Review of Systems Review of Systems Per HPI  Physical Exam Triage Vital Signs ED Triage Vitals  Enc Vitals Group     BP 11/26/20 1140 126/73     Pulse Rate 11/26/20 1140 68     Resp 11/26/20 1140 16     Temp 11/26/20 1140 98.5 F (36.9 C)     Temp Source 11/26/20 1140 Oral     SpO2 11/26/20 1140 98 %     Weight --      Height --      Head Circumference --      Peak Flow --      Pain Score 11/26/20 1146 8     Pain Loc --      Pain Edu? --      Excl. in GC? --    No data found.  Updated Vital Signs BP 126/73 (BP Location: Left Arm)   Pulse 68   Temp 98.5 F (36.9 C) (Oral)   Resp 16   LMP  (LMP Unknown)   SpO2 98%   Breastfeeding Unknown   Visual Acuity Right Eye Distance:   Left Eye Distance:   Bilateral Distance:    Right Eye Near:   Left  Eye Near:    Bilateral Near:     Physical Exam Constitutional:      Appearance: Normal appearance.  HENT:     Head: Normocephalic and atraumatic.  Eyes:     Extraocular Movements: Extraocular movements intact.     Conjunctiva/sclera: Conjunctivae normal.  Cardiovascular:     Rate and Rhythm: Normal rate and regular rhythm.     Pulses: Normal pulses.     Heart sounds: Normal heart sounds.  Pulmonary:     Effort: Pulmonary effort is normal. No respiratory distress.     Breath sounds: Normal breath sounds.  Abdominal:     General: Abdomen is flat. Bowel sounds are normal. There is no distension.     Palpations: Abdomen is soft.     Tenderness: There is abdominal tenderness in the right lower quadrant. There is no right CVA tenderness or left CVA tenderness. Negative signs include Murphy's sign and McBurney's sign.  Skin:    General: Skin is warm and dry.  Neurological:     General: No focal deficit present.     Mental Status: She is alert and oriented to person, place, and time. Mental status is at baseline.  Psychiatric:        Mood and Affect: Mood normal.        Behavior: Behavior normal.        Thought Content: Thought content normal.        Judgment: Judgment normal.     UC Treatments / Results  Labs (all labs ordered are listed, but only abnormal results are displayed) Labs Reviewed  POCT URINALYSIS DIP (MANUAL ENTRY) - Abnormal; Notable for the following components:      Result Value   Blood, UA trace-intact (*)    Protein Ur, POC trace (*)    All other components within normal limits    EKG   Radiology No results found.  Procedures Procedures (including critical care time)  Medications Ordered in UC Medications - No data to display  Initial Impression / Assessment and Plan / UC Course  I have reviewed the triage vital signs and the nursing notes.  Pertinent labs & imaging results that were available during my care of the patient were reviewed by me  and considered in my medical decision making (see chart for details).     Advised patient that she will need to be further evaluated and managed at the hospital due to severe abdominal pain.  Patient has severe right lower quadrant pain and recent miscarriage that indicates further work-up at the hospital.  Urinalysis was not definitive for urinary tract infection, so do not think this is the cause of abdominal pain.  No need for urine pregnancy at this time as it would most likely be positive due to recent miscarriage.  Patient was agreeable with plan.  Vital signs stable at discharge.  Agree with patient self transport to the hospital. Final Clinical Impressions(s) / UC Diagnoses   Final diagnoses:  Right lower quadrant pain     Discharge Instructions      Please go to the hospital as soon as you leave urgent care for further evaluation and management.     ED Prescriptions   None    PDMP not reviewed this encounter.   Lance Muss, FNP 11/26/20 1236

## 2020-11-26 NOTE — Discharge Instructions (Addendum)
Please go to the hospital as soon as you leave urgent care for further evaluation and management. 

## 2020-11-27 ENCOUNTER — Telehealth: Payer: Self-pay | Admitting: Family Medicine

## 2020-11-27 NOTE — Telephone Encounter (Signed)
Patient seen in urgent care yesterday, blood in her urine, patient want top speak to a nurse

## 2020-11-28 NOTE — Telephone Encounter (Signed)
Call placed to pt. Spoke with pt. Pt states went to urgent care due to right lower abd pain. Pt denies fever, severe pain to RLQ, nausea or vomiting. Pt states was advised by urgent care to go to ER, but did not go due to long wait times. Pt states has trace of blood in urine, but also had recent SAB on 9/15. Pt did not come on 9/21 for follow up beta and blood in urine could from SAB. PT advised to come in for urine as nurse visit. Pt agreeable. Pt scheduled for nurse visit on 9/28 at 2pm. Pt verbalized understanding to date and time of appt.   Judeth Cornfield, RN

## 2020-11-29 ENCOUNTER — Ambulatory Visit: Payer: Medicaid Other

## 2021-01-15 ENCOUNTER — Encounter (HOSPITAL_COMMUNITY): Payer: Self-pay | Admitting: Emergency Medicine

## 2021-01-15 ENCOUNTER — Ambulatory Visit (INDEPENDENT_AMBULATORY_CARE_PROVIDER_SITE_OTHER): Payer: Medicaid Other

## 2021-01-15 ENCOUNTER — Ambulatory Visit (HOSPITAL_COMMUNITY): Payer: Medicaid Other

## 2021-01-15 ENCOUNTER — Other Ambulatory Visit: Payer: Self-pay

## 2021-01-15 ENCOUNTER — Ambulatory Visit (HOSPITAL_COMMUNITY)
Admission: EM | Admit: 2021-01-15 | Discharge: 2021-01-15 | Disposition: A | Discharge status: Home / Self Care | Payer: Medicaid Other | Attending: Emergency Medicine | Admitting: Emergency Medicine

## 2021-01-15 DIAGNOSIS — M25572 Pain in left ankle and joints of left foot: Secondary | ICD-10-CM

## 2021-01-15 DIAGNOSIS — M542 Cervicalgia: Secondary | ICD-10-CM

## 2021-01-15 DIAGNOSIS — M25512 Pain in left shoulder: Secondary | ICD-10-CM

## 2021-01-15 DIAGNOSIS — R0781 Pleurodynia: Secondary | ICD-10-CM

## 2021-01-15 MED ORDER — MELOXICAM 7.5 MG PO TABS: 7.5000 mg | ORAL_TABLET | Freq: Every day | ORAL | 0 refills | Status: DC

## 2021-01-15 MED ORDER — PREDNISONE 20 MG PO TABS: 40.0000 mg | ORAL_TABLET | Freq: Every day | ORAL | 0 refills | Status: DC

## 2021-01-15 MED ORDER — CYCLOBENZAPRINE HCL 10 MG PO TABS: 10.0000 mg | ORAL_TABLET | Freq: Every day | ORAL | 0 refills | Status: DC
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
## 2021-01-15 NOTE — Discharge Instructions (Addendum)
Your x-ray today did not show injury to the bone, ligaments or tendons of your ankle. Your pain is most likely being caused by irritation to the soft tissues, this should improve as time progresses.   Starting tomorrow take prednisone every morning with food for 5 days  After completion of prednisone take meloxicam every morning for 5 days then as needed needed at bedtime for additional comfort You may apply heat or ice, whichever makes you feel better, to affected area in 15 minute intervals  You may continue activity as tolerated, there is no injury therefore, it is important that you continue to move around so you do not loose strength to the area  For the first 2-3 days you may wrap ankle with ace wrap for additional support while completing activities, once wrapped if you begin to experience numbness or tingling it is too tight, remove and redo, you should be able to easily fit one finger under wrap   If symptoms persist past 2 weeks, you may follow up at urgent care or with orthopedic specialist for evaluation, an orthopedic doctor specializes in the bone, they may provide  management such as but not limited to imaging, long term medications and physical therapy   Your pain is most likely caused by irritation to the muscles or ligaments.   You may use heating pad in 15 minute intervals as needed for additional comfort, within the first 2-3 days you may find comfort in using ice in 10-15 minutes over affected area  Begin stretching affected area daily for 10 minutes as tolerated to further loosen muscles   When lying down place pillow underneath and between knees for support  Can try sleeping without pillow on firm mattress   Practice good posture: head back, shoulders back, chest forward, pelvis back and weight distributed evenly on both legs  If pain persist after recommended treatment or reoccurs if may be beneficial to follow up with orthopedic specialist for evaluation, this doctor  specializes in the bones and can manage your symptoms long-term with options such as but not limited to imaging, medications or physical therapy

## 2021-03-04 NOTE — L&D Delivery Note (Signed)
[facility] Delivery Note  Monica Nash is a 26 y.o. [id number] s/p VBAC at [redacted]w[redacted]d. She was admitted for IOL due to severe FGR 3rd% w nl dopplers.   ROM: 5h 69m with clear fluid GBS Status: neg Maximum Maternal Temperature: 98.4  Labor Progress: [name] was admitted at  midnight on 02/13/22 for IOL due to severe FGR; she a cervical foley, Pitocin, and AROM as IOL methods; she progressed to complete at 2126 and pushed well to a successful VBAC.  Delivery Date/Time: December 13th, 2023 at 2221 Delivery: Called to room and patient was complete and pushing. Head delivered LOA. No nuchal cord present. Shoulder and body delivered in usual fashion. Infant with spontaneous cry, placed on mother's abdomen, dried and stimulated. Cord clamped x 2 after 1-minute delay, and cut by FOB. Cord blood drawn. Placenta delivered spontaneously with gentle cord traction. Fundus firm with massage and Pitocin. Labia, perineum, vagina, and cervix inspected and found to be intact.   Placenta: spont, intact; to L&D Complications: none Lacerations: none EBL: 73cc Analgesia: epidural  Postpartum Planning [x]  message to sent to schedule follow-up   Infant: girl  APGARs 9/9  2350g (5lb 2.9oz)  11/9, CNM  02/13/2022 10:52 PM

## 2021-06-21 ENCOUNTER — Ambulatory Visit
Admission: EM | Admit: 2021-06-21 | Discharge: 2021-06-21 | Disposition: A | Discharge status: Home / Self Care | Payer: Medicaid Other | Attending: Urgent Care | Admitting: Urgent Care

## 2021-06-21 ENCOUNTER — Encounter: Payer: Self-pay | Admitting: Emergency Medicine

## 2021-06-21 ENCOUNTER — Other Ambulatory Visit: Payer: Self-pay

## 2021-06-21 DIAGNOSIS — K047 Periapical abscess without sinus: Secondary | ICD-10-CM

## 2021-06-21 HISTORY — DX: Encounter for supervision of normal pregnancy, unspecified, unspecified trimester: Z34.90

## 2021-06-21 MED ORDER — PENICILLIN V POTASSIUM 500 MG PO TABS: 500.0000 mg | ORAL_TABLET | Freq: Four times a day (QID) | ORAL | 0 refills | Status: DC

## 2021-06-21 NOTE — Discharge Instructions (Addendum)
You are starting to develop a dental infection and a broken tooth. ?Unfortunately there are not many things we can give for pain due to your pregnancy. ?You can take up to 3 g of acetaminophen/Tylenol daily.  This equates to one 500 mg tablet up to 6 times daily.  Use the smallest amount of Tylenol for the shortest amount of time possible. ?Follow-up with your dentist as discussed. ?

## 2021-06-21 NOTE — ED Triage Notes (Signed)
Left, bottom tooth pain.  Has a scheduled appt on Monday with dentist.  Patient is [redacted] weeks pregnant ?

## 2021-06-21 NOTE — ED Provider Notes (Signed)
?[facility] ? ? ? ?[name]: [id number] ?Arrival date & time: 06/21/21  1829 ? ? ?  ? ?History   ?Chief Complaint ?Chief Complaint  ?Patient presents with  ? Dental Pain  ? ? ?HPI ?Monica Nash is a 26 y.o. female.  ? ?Pleasant 26 year old female presents today due to concerns of an infection of the tooth on her left lower jaw.  She states has been bothering her for several days.  Previously had a filling which broke off.  She has been taking 500 mg of Tylenol twice daily without significant relief.  She also has been using Orajel.  She has an appointment with her dentist in 4 days, on Monday.  She reports the pain is very severe.  She states some mild swelling to her lymph node on the left.  She denies fever or any systemic symptoms. She is [redacted] weeks pregnant. ? ? ?Dental Pain ? ?Past Medical History:  ?Diagnosis Date  ? Anemia   ? Pregnant   ? UTI (urinary tract infection)   ? ? ?Patient Active Problem List  ? Diagnosis Date Noted  ? Choledocholithiasis 11/11/2017  ? Elevated liver enzymes 11/11/2017  ? Nausea and vomiting 11/11/2017  ? Hypokalemia 11/11/2017  ? Chest pain 11/10/2017  ? SOB (shortness of breath) 11/10/2017  ? S/P primary low transverse C-section 01/27/2015  ? ? ?Past Surgical History:  ?Procedure Laterality Date  ? CESAREAN SECTION N/A 01/27/2015  ? Procedure: CESAREAN SECTION;  Surgeon: Truett Mainland, DO;  Location: [facility];  Service: Obstetrics;  Laterality: N/A;  ? CHOLECYSTECTOMY N/A 11/11/2017  ? Procedure: LAPAROSCOPIC CHOLECYSTECTOMY WITH INTRAOPERATIVE CHOLANGIOGRAM;  Surgeon: Leighton Ruff, MD;  Location: [facility];  Service: General;  Laterality: N/A;  ? ? ?OB History   ? ? Gravida  ?4  ? Para  ?1  ? Term  ?1  ? Preterm  ?   ? AB  ?1  ? Living  ?1  ?  ? ? SAB  ?1  ? IAB  ?   ? Ectopic  ?   ? Multiple  ?0  ? Live Births  ?1  ?   ?  ?  ? ? ? ?Home Medications   ? ?Prior to Admission medications   ?Medication Sig Start Date End Date Taking? Authorizing Provider  ?penicillin v potassium  (VEETID) 500 MG tablet Take 1 tablet (500 mg total) by mouth 4 (four) times daily for 7 days. 06/21/21 06/28/21 Yes Kaysan Peixoto L, PA  ?Prenatal Vit-Fe Fumarate-FA (PREPLUS) 27-1 MG TABS Take 1 tablet by mouth daily.    [provider]  ? ? ?Family History ?Family History  ?Problem Relation Age of Onset  ? Cancer Sister   ? ? ?Social History ?Social History  ? ?Tobacco Use  ? Smoking status: Never  ? Smokeless tobacco: Never  ?Vaping Use  ? Vaping Use: Former  ?Substance Use Topics  ? Alcohol use: No  ? Drug use: Not Currently  ?  Types: Marijuana  ? ? ? ?Allergies   ?Patient has no known allergies. ? ? ?Review of Systems ?Review of Systems  ?HENT:  Positive for dental problem.   ?All other systems reviewed and are negative. ? ? ?Physical Exam ?Triage Vital Signs ?ED Triage Vitals  ?Enc Vitals Group  ?   BP 06/21/21 1954 122/73  ?   Pulse Rate 06/21/21 1954 61  ?   Resp 06/21/21 1954 14  ?   Temp 06/21/21 1954 (!) 97.4 ?F (36.3 ?  C)  ?   Temp Source 06/21/21 1954 Oral  ?   SpO2 06/21/21 1954 98 %  ?   Weight --   ?   Height --   ?   Head Circumference --   ?   Peak Flow --   ?   Pain Score 06/21/21 2004 10  ?   Pain Loc --   ?   Pain Edu? --   ?   Excl. in [place]? --   ? ?No data found. ? ?Updated Vital Signs ?BP 122/73 (BP Location: Left Arm)   Pulse 61   Temp (!) 97.4 ?F (36.3 ?C) (Oral)   Resp 14   LMP 05/17/2021 (Approximate)   SpO2 98%  ? ?Visual Acuity ?Right Eye Distance:   ?Left Eye Distance:   ?Bilateral Distance:   ? ?Right Eye Near:   ?Left Eye Near:    ?Bilateral Near:    ? ?Physical Exam ?Vitals and nursing note reviewed.  ?Constitutional:   ?   General: She is not in acute distress. ?   Appearance: Normal appearance. She is normal weight. She is not ill-appearing, toxic-appearing or diaphoretic.  ?HENT:  ?   Head: Normocephalic.  ?   Jaw: There is normal jaw occlusion. No swelling.  ?   Salivary Glands: Right salivary gland is not diffusely enlarged or tender. Left salivary gland is not  diffusely enlarged or tender.  ?   Mouth/Throat:  ?   Mouth: Mucous membranes are moist. No lacerations or oral lesions.  ?   Dentition: Abnormal dentition. Dental tenderness and dental caries present. No gingival swelling or dental abscesses.  ?   Palate: No mass.  ?   Pharynx: Oropharynx is clear. Uvula midline. No pharyngeal swelling, oropharyngeal exudate, posterior oropharyngeal erythema or uvula swelling.  ?   Tonsils: No tonsillar exudate or tonsillar abscesses.  ?   Comments: Severe dental decay to the posterior two molars on bottom left ?Lymphadenopathy:  ?   Cervical: Cervical adenopathy (submandibular lymphadenopathy on left only) present.  ?Neurological:  ?   Mental Status: She is alert.  ? ? ? ?UC Treatments / Results  ?Labs ?(all labs ordered are listed, but only abnormal results are displayed) ?Labs Reviewed - No data to display ? ?EKG ? ? ?Radiology ?No results found. ? ?Procedures ?Procedures (including critical care time) ? ?Medications Ordered in UC ?Medications - No data to display ? ?Initial Impression / Assessment and Plan / UC Course  ?I have reviewed the triage vital signs and the nursing notes. ? ?Pertinent labs & imaging results that were available during my care of the patient were reviewed by me and considered in my medical decision making (see chart for details). ? ?  ? ?Dental infection - due to pregnancy, tylenol is the only medication that is safe to take. Must avoid NSAIDs. Supportive care pain wise. Will start PCN as this is safe with pregnancy.  Start taking twice daily until completed, keep your appointment with your dentist on Monday.  If you develop a fever or any worsening swelling or adenopathy, please head to the emergency room. ? ?Final Clinical Impressions(s) / UC Diagnoses  ? ?Final diagnoses:  ?Dental infection  ? ? ? ?Discharge Instructions   ? ?  ?You are starting to develop a dental infection and a broken tooth. ?Unfortunately there are not many things we can give for  pain due to your pregnancy. ?You can take up to 3 g of acetaminophen/Tylenol daily.  This equates to one 500 mg tablet up to 6 times daily.  Use the smallest amount of Tylenol for the shortest amount of time possible. ?Follow-up with your dentist as discussed. ? ? ?ED Prescriptions   ? ? Medication Sig Dispense Auth. Provider  ? penicillin v potassium (VEETID) 500 MG tablet Take 1 tablet (500 mg total) by mouth 4 (four) times daily for 7 days. 28 tablet Jettie Lazare L, PA  ? ?  ? ?PDMP not reviewed this encounter. ?  Chaney Malling, [place] ?06/21/21 2026 ? ?

## 2021-06-26 ENCOUNTER — Ambulatory Visit (INDEPENDENT_AMBULATORY_CARE_PROVIDER_SITE_OTHER): Payer: Medicaid Other

## 2021-06-26 DIAGNOSIS — Z3401 Encounter for supervision of normal first pregnancy, first trimester: Secondary | ICD-10-CM

## 2021-06-26 DIAGNOSIS — N912 Amenorrhea, unspecified: Secondary | ICD-10-CM

## 2021-06-26 LAB — POCT URINE PREGNANCY: Preg Test, Ur: POSITIVE — AB

## 2021-06-26 MED ORDER — VITAFOL ULTRA 29-0.6-0.4-200 MG PO CAPS: 1.0000 | ORAL_CAPSULE | Freq: Every day | ORAL | 11 refills | Status: DC

## 2021-06-26 NOTE — Progress Notes (Signed)
Patient was assessed and managed by nursing staff during this encounter. I have reviewed the chart and agree with the documentation and plan. I have also made any necessary editorial changes. ? ?Mora Bellman, MD ?06/26/2021 4:20 PM  ? ?

## 2021-06-26 NOTE — Progress Notes (Signed)
Monica Nash presents today for UPT. She has no unusual complaints. ?LMP: 05/17/21 Patient is [redacted]w[redacted]d today. EDD 02/21/22. ?   ?OBJECTIVE: Appears well, in no apparent distress.  ?OB History   ? ? Gravida  ?4  ? Para  ?1  ? Term  ?1  ? Preterm  ?   ? AB  ?2  ? Living  ?1  ?  ? ? SAB  ?2  ? IAB  ?   ? Ectopic  ?   ? Multiple  ?0  ? Live Births  ?1  ?   ?  ?  ? ?Home UPT Result: Positive ?In-Office UPT result: Positive ?I have reviewed the patient's medical, obstetrical, social, and family histories, and medications.  ? ?ASSESSMENT: Positive pregnancy test ? ?PLAN ?Prenatal care to be completed at: [facility] ?Patient to be scheduled for New OB Intake ?Patient to be scheduled for NEW OB provider visit ?Prenatal vitamins sent to the pharmacy. ?  ?

## 2021-07-19 ENCOUNTER — Other Ambulatory Visit: Payer: Self-pay

## 2021-07-19 NOTE — Progress Notes (Signed)
Faxed breast pump rx to [facility]

## 2021-07-23 ENCOUNTER — Other Ambulatory Visit: Payer: Self-pay | Admitting: Obstetrics and Gynecology

## 2021-07-23 ENCOUNTER — Ambulatory Visit (INDEPENDENT_AMBULATORY_CARE_PROVIDER_SITE_OTHER): Payer: Medicaid Other | Admitting: *Deleted

## 2021-07-23 ENCOUNTER — Ambulatory Visit (INDEPENDENT_AMBULATORY_CARE_PROVIDER_SITE_OTHER): Payer: Medicaid Other

## 2021-07-23 VITALS — BP 104/73 | HR 90 | Ht 64.0 in | Wt 101.1 lb

## 2021-07-23 DIAGNOSIS — Z3480 Encounter for supervision of other normal pregnancy, unspecified trimester: Secondary | ICD-10-CM

## 2021-07-23 DIAGNOSIS — O3680X0 Pregnancy with inconclusive fetal viability, not applicable or unspecified: Secondary | ICD-10-CM

## 2021-07-23 DIAGNOSIS — O219 Vomiting of pregnancy, unspecified: Secondary | ICD-10-CM

## 2021-07-23 MED ORDER — PROMETHAZINE HCL 25 MG PO TABS: 25.0000 mg | ORAL_TABLET | Freq: Four times a day (QID) | ORAL | 1 refills | Status: DC | PRN

## 2021-07-23 MED ORDER — BLOOD PRESSURE KIT DEVI: 1.0000 | 0 refills | Status: DC

## 2021-07-23 MED ORDER — GOJJI WEIGHT SCALE MISC: 1.0000 | 0 refills | Status: DC

## 2021-07-23 NOTE — Progress Notes (Signed)
New OB Intake  I connected with  Monica Nash on 07/23/21 at  3:10 PM EDT by in person and verified that I am speaking with the correct person using two identifiers. Nurse is located at [facility] and pt is located at [facility].  I discussed the limitations, risks, security and privacy concerns of performing an evaluation and management service by telephone and the availability of in person appointments. I also discussed with the patient that there may be a patient responsible charge related to this service. The patient expressed understanding and agreed to proceed.  I explained I am completing New OB Intake today. We discussed her EDD of 03/02/22 that is based on early u/s. Pt is G4/P1021. I reviewed her allergies, medications, Medical/Surgical/OB history, and appropriate screenings. I informed her of [facility] services. Based on history, this is a/an  pregnancy uncomplicated .   Patient Active Problem List   Diagnosis Date Noted   Choledocholithiasis 11/11/2017   Elevated liver enzymes 11/11/2017   Nausea and vomiting 11/11/2017   Hypokalemia 11/11/2017   Chest pain 11/10/2017   SOB (shortness of breath) 11/10/2017   S/P primary low transverse C-section 01/27/2015    Concerns addressed today  Delivery Plans:  Plans to deliver at [facility].   MyChart/Babyscripts MyChart access verified. I explained pt will have some visits in office and some virtually. Babyscripts instructions given and order placed. Patient verifies receipt of registration text/e-mail. Account successfully created and app downloaded.  Blood Pressure Cuff  Blood pressure cuff ordered for patient to pick-up from [employer]. Explained after first prenatal appt pt will check weekly and document in 79.  Weight scale: Patient does / does not  have weight scale. Weight scale ordered for patient to pick up from [employer].   Anatomy US Explained first scheduled [place] will be around 19 weeks. Dating and viability  scan performed today.  Labs Discussed Johnsie Cancel genetic screening with patient. Would like both Panorama and Horizon drawn at new OB visit.Also if interested in genetic testing, tell patient she will need AFP 15-21 weeks to complete genetic testing .Routine prenatal labs needed.  Covid Vaccine Patient has not covid vaccine.   Is patient a CenteringPregnancy candidate?  Declined Declined due to Group Setting Not a candidate due to NAIs patient a CenteringPregnancy candidate?  NA  not interested "Centering Patient" indicated on sticky note      Is patient interested in [place]?  Yes  Yes "Interested in [facility] - Schedule next visit with CNM" on sticky note  Informed patient of [facility] website  and placed link in her AVS.   Social Determinants of Health Food Insecurity: Patient denies food insecurity. WIC Referral: Patient is interested in referral to [facility]  Transportation: Patient expressed transportation needs. Childcare: Discussed no children allowed at ultrasound appointments. Offered childcare services; patient declines childcare services at this time.  Send link to Pregnancy Navigators   Placed OB Box on problem list and updated  First visit review I reviewed new OB appt with pt. I explained she will have a pelvic exam, ob bloodwork with genetic screening, and PAP smear. Explained pt will be seen by Dr. Rip Harbour at first visit; encounter routed to appropriate provider. Explained that patient will be seen by pregnancy navigator following visit with provider. [facility] information placed in AVS.   Lucianne Lei, RN 07/23/2021  3:06 PM

## 2021-08-07 ENCOUNTER — Other Ambulatory Visit (HOSPITAL_COMMUNITY)
Admission: RE | Admit: 2021-08-07 | Discharge: 2021-08-07 | Disposition: A | Discharge status: Home / Self Care | Payer: Medicaid Other | Source: Ambulatory Visit | Attending: Obstetrics and Gynecology | Admitting: Obstetrics and Gynecology

## 2021-08-07 ENCOUNTER — Encounter: Payer: Self-pay | Admitting: Obstetrics and Gynecology

## 2021-08-07 ENCOUNTER — Ambulatory Visit (INDEPENDENT_AMBULATORY_CARE_PROVIDER_SITE_OTHER): Payer: Medicaid Other | Admitting: Obstetrics and Gynecology

## 2021-08-07 VITALS — BP 112/63 | HR 80 | Wt 108.0 lb

## 2021-08-07 DIAGNOSIS — Z131 Encounter for screening for diabetes mellitus: Secondary | ICD-10-CM | POA: Diagnosis not present

## 2021-08-07 DIAGNOSIS — Z98891 History of uterine scar from previous surgery: Secondary | ICD-10-CM | POA: Insufficient documentation

## 2021-08-07 DIAGNOSIS — Z3480 Encounter for supervision of other normal pregnancy, unspecified trimester: Secondary | ICD-10-CM | POA: Insufficient documentation

## 2021-08-07 DIAGNOSIS — Z3481 Encounter for supervision of other normal pregnancy, first trimester: Secondary | ICD-10-CM | POA: Diagnosis not present

## 2021-08-07 DIAGNOSIS — Z3A10 10 weeks gestation of pregnancy: Secondary | ICD-10-CM | POA: Diagnosis not present

## 2021-08-07 NOTE — Patient Instructions (Signed)
First Trimester of Pregnancy  The first trimester of pregnancy starts on the first day of your last menstrual period until the end of week 12. This is months 1 through 3 of pregnancy. A week after a sperm fertilizes an egg, the egg will implant into the wall of the uterus and begin to develop into a baby. By the end of 12 weeks, all the baby's organs will be formed and the baby will be 2-3 inches in size. Body changes during your first trimester Your body goes through many changes during pregnancy. The changes vary and generally return to normal after your baby is born. Physical changes You may gain or lose weight. Your breasts may begin to grow larger and become tender. The tissue that surrounds your nipples (areola) may become darker. Dark spots or blotches (chloasma or mask of pregnancy) may develop on your face. You may have changes in your hair. These can include thickening or thinning of your hair or changes in texture. Health changes You may feel nauseous, and you may vomit. You may have heartburn. You may develop headaches. You may develop constipation. Your gums may bleed and may be sensitive to brushing and flossing. Other changes You may tire easily. You may urinate more often. Your menstrual periods will stop. You may have a loss of appetite. You may develop cravings for certain kinds of food. You may have changes in your emotions from day to day. You may have more vivid and strange dreams. Follow these instructions at home: Medicines Follow your health care provider's instructions regarding medicine use. Specific medicines may be either safe or unsafe to take during pregnancy. Do not take any medicines unless told to by your health care provider. Take a prenatal vitamin that contains at least 600 micrograms (mcg) of folic acid. Eating and drinking Eat a healthy diet that includes fresh fruits and vegetables, whole grains, good sources of protein such as meat, eggs, or tofu,  and low-fat dairy products. Avoid raw meat and unpasteurized juice, milk, and cheese. These carry germs that can harm you and your baby. If you feel nauseous or you vomit: Eat 4 or 5 small meals a day instead of 3 large meals. Try eating a few soda crackers. Drink liquids between meals instead of during meals. You may need to take these actions to prevent or treat constipation: Drink enough fluid to keep your urine pale yellow. Eat foods that are high in fiber, such as beans, whole grains, and fresh fruits and vegetables. Limit foods that are high in fat and processed sugars, such as fried or sweet foods. Activity Exercise only as directed by your health care provider. Most people can continue their usual exercise routine during pregnancy. Try to exercise for 30 minutes at least 5 days a week. Stop exercising if you develop pain or cramping in the lower abdomen or lower back. Avoid exercising if it is very hot or humid or if you are at high altitude. Avoid heavy lifting. If you choose to, you may have sex unless your health care provider tells you not to. Relieving pain and discomfort Wear a good support bra to relieve breast tenderness. Rest with your legs elevated if you have leg cramps or low back pain. If you develop bulging veins (varicose veins) in your legs: Wear support hose as told by your health care provider. Elevate your feet for 15 minutes, 3-4 times a day. Limit salt in your diet. Safety Wear your seat belt at all times when   driving or riding in a car. Talk with your health care provider if someone is verbally or physically abusive to you. Talk with your health care provider if you are feeling sad or have thoughts of hurting yourself. Lifestyle Do not use hot tubs, steam rooms, or saunas. Do not douche. Do not use tampons or scented sanitary pads. Do not use herbal remedies, alcohol, illegal drugs, or medicines that are not approved by your health care provider. Chemicals  in these products can harm your baby. Do not use any products that contain nicotine or tobacco, such as cigarettes, e-cigarettes, and chewing tobacco. If you need help quitting, ask your health care provider. Avoid cat litter boxes and soil used by cats. These carry germs that can cause birth defects in the baby and possibly loss of the unborn baby (fetus) by miscarriage or stillbirth. General instructions During routine prenatal visits in the first trimester, your health care provider will do a physical exam, perform necessary tests, and ask you how things are going. Keep all follow-up visits. This is important. Ask for help if you have counseling or nutritional needs during pregnancy. Your health care provider can offer advice or refer you to specialists for help with various needs. Schedule a dentist appointment. At home, brush your teeth with a soft toothbrush. Floss gently. Write down your questions. Take them to your prenatal visits. Where to find more information American Pregnancy Association: americanpregnancy.org American College of Obstetricians and Gynecologists: acog.org/en/Womens%20Health/Pregnancy Office on Women's Health: womenshealth.gov/pregnancy Contact a health care provider if you have: Dizziness. A fever. Mild pelvic cramps, pelvic pressure, or nagging pain in the abdominal area. Nausea, vomiting, or diarrhea that lasts for 24 hours or longer. A bad-smelling vaginal discharge. Pain when you urinate. Known exposure to a contagious illness, such as chickenpox, measles, Zika virus, HIV, or hepatitis. Get help right away if you have: Spotting or bleeding from your vagina. Severe abdominal cramping or pain. Shortness of breath or chest pain. Any kind of trauma, such as from a fall or a car crash. New or increased pain, swelling, or redness in an arm or leg. Summary The first trimester of pregnancy starts on the first day of your last menstrual period until the end of week  12 (months 1 through 3). Eating 4 or 5 small meals a day rather than 3 large meals may help to relieve nausea and vomiting. Do not use any products that contain nicotine or tobacco, such as cigarettes, e-cigarettes, and chewing tobacco. If you need help quitting, ask your health care provider. Keep all follow-up visits. This is important. This information is not intended to replace advice given to you by your health care provider. Make sure you discuss any questions you have with your health care provider. Document Revised: 07/28/2019 Document Reviewed: 06/03/2019 Elsevier Patient Education  2023 [employer]  

## 2021-08-07 NOTE — Progress Notes (Signed)
Subjective:  Kjirsten Bloodgood is a 26 y.o. G4P1021 at [redacted]w[redacted]d being seen today for her first OB visit. EDD by first trimester U/S. H/O LTCS due to malpresentation. Denies any chronic medical problems or medications.  She is currently monitored for the following issues for this low-risk pregnancy and has Supervision of other normal pregnancy, antepartum and History of cesarean section on their problem list.  Patient reports no complaints.  Contractions: Not present. Vag. Bleeding: None.   . Denies leaking of fluid.   The following portions of the patient's history were reviewed and updated as appropriate: allergies, current medications, past family history, past medical history, past social history, past surgical history and problem list. Problem list updated.  Objective:   Vitals:   08/07/21 1555  BP: 112/63  Pulse: 80  Weight: 108 lb (49 kg)    Fetal Status: Fetal Heart Rate (bpm): 160         General:  Alert, oriented and cooperative. Patient is in no acute distress.  Skin: Skin is warm and dry. No rash noted.   Cardiovascular: Normal heart rate noted  Respiratory: Normal respiratory effort, no problems with respiration noted  Abdomen: Soft, gravid, appropriate for gestational age. Pain/Pressure: Absent     Pelvic:  Cervical exam performed Dilation: Closed      Extremities: Normal range of motion.     Mental Status: Normal mood and affect. Normal behavior. Normal judgment and thought content.   Urinalysis:      Assessment and Plan:  Pregnancy: G4P1021 at [redacted]w[redacted]d  1. Supervision of other normal pregnancy, antepartum Prenatal labs and care reviewed with pt Genetic testing discussed - Cytology - PAP( [facility]) - Cervicovaginal ancillary only( [place] [facility]) - CBC/D/Plt+RPR+Rh+ABO+RubIgG... - Genetic Screening - Culture, OB Urine - HgB A1c - [place] MFM OB COMP + 14 WK; Future  2. History of cesarean section Discuss VBAC vs RLTCS at later OB appts  Preterm labor symptoms and general  obstetric precautions including but not limited to vaginal bleeding, contractions, leaking of fluid and fetal movement were reviewed in detail with the patient. Please refer to After Visit Summary for other counseling recommendations.  Return in about 4 weeks (around 09/04/2021) for OB visit, face to face, any provider.   Hermina Staggers, MD

## 2021-08-08 LAB — CBC/D/PLT+RPR+RH+ABO+RUBIGG...
Antibody Screen: NEGATIVE
Basophils Absolute: 0 10*3/uL (ref 0.0–0.2)
Basos: 0 %
EOS (ABSOLUTE): 0.1 10*3/uL (ref 0.0–0.4)
Eos: 1 %
HCV Ab: NONREACTIVE
HIV Screen 4th Generation wRfx: NONREACTIVE
Hematocrit: 28.3 % — ABNORMAL LOW (ref 34.0–46.6)
Hemoglobin: 9.5 g/dL — ABNORMAL LOW (ref 11.1–15.9)
Hepatitis B Surface Ag: NEGATIVE
Immature Grans (Abs): 0.1 10*3/uL (ref 0.0–0.1)
Immature Granulocytes: 1 %
Lymphocytes Absolute: 1.6 10*3/uL (ref 0.7–3.1)
Lymphs: 16 %
MCH: 29.8 pg (ref 26.6–33.0)
MCHC: 33.6 g/dL (ref 31.5–35.7)
MCV: 89 fL (ref 79–97)
Monocytes Absolute: 0.6 10*3/uL (ref 0.1–0.9)
Monocytes: 6 %
Neutrophils Absolute: 7.9 10*3/uL — ABNORMAL HIGH (ref 1.4–7.0)
Neutrophils: 76 %
Platelets: 251 10*3/uL (ref 150–450)
RBC: 3.19 x10E6/uL — ABNORMAL LOW (ref 3.77–5.28)
RDW: 13.5 % (ref 11.7–15.4)
RPR Ser Ql: NONREACTIVE
Rh Factor: POSITIVE
Rubella Antibodies, IGG: 5.71 index (ref >0.99)
WBC: 10.2 10*3/uL (ref 3.4–10.8)

## 2021-08-08 LAB — HEMOGLOBIN A1C
Est. average glucose Bld gHb Est-mCnc: 120 mg/dL
Hgb A1c MFr Bld: 5.8 % — ABNORMAL HIGH (ref 4.8–5.6)

## 2021-08-08 LAB — HCV INTERPRETATION

## 2021-08-09 LAB — CYTOLOGY - PAP
Comment: NEGATIVE
Diagnosis: NEGATIVE
High risk HPV: NEGATIVE

## 2021-08-09 LAB — CERVICOVAGINAL ANCILLARY ONLY
Chlamydia: NEGATIVE
Comment: NEGATIVE
Comment: NEGATIVE
Comment: NORMAL
Neisseria Gonorrhea: NEGATIVE
Trichomonas: NEGATIVE

## 2021-08-09 LAB — CULTURE, OB URINE

## 2021-08-09 LAB — URINE CULTURE, OB REFLEX: Organism ID, Bacteria: NO GROWTH

## 2021-09-03 ENCOUNTER — Encounter: Payer: Medicaid Other | Admitting: Obstetrics and Gynecology

## 2021-09-03 ENCOUNTER — Encounter: Payer: Medicaid Other | Admitting: Obstetrics

## 2021-09-13 ENCOUNTER — Telehealth: Payer: Self-pay

## 2021-10-08 ENCOUNTER — Telehealth: Payer: Self-pay

## 2021-10-08 ENCOUNTER — Ambulatory Visit: Payer: Medicaid Other

## 2021-10-08 DIAGNOSIS — Z3480 Encounter for supervision of other normal pregnancy, unspecified trimester: Secondary | ICD-10-CM

## 2021-10-09 ENCOUNTER — Ambulatory Visit: Payer: Medicaid Other | Attending: Obstetrics and Gynecology

## 2021-10-09 DIAGNOSIS — O321XX0 Maternal care for breech presentation, not applicable or unspecified: Secondary | ICD-10-CM | POA: Insufficient documentation

## 2021-10-09 DIAGNOSIS — O34219 Maternal care for unspecified type scar from previous cesarean delivery: Secondary | ICD-10-CM | POA: Diagnosis not present

## 2021-10-09 DIAGNOSIS — Z148 Genetic carrier of other disease: Secondary | ICD-10-CM | POA: Insufficient documentation

## 2021-10-09 DIAGNOSIS — Z363 Encounter for antenatal screening for malformations: Secondary | ICD-10-CM | POA: Insufficient documentation

## 2021-10-09 DIAGNOSIS — Z3480 Encounter for supervision of other normal pregnancy, unspecified trimester: Secondary | ICD-10-CM | POA: Diagnosis not present

## 2021-10-09 DIAGNOSIS — Z3A19 19 weeks gestation of pregnancy: Secondary | ICD-10-CM | POA: Insufficient documentation

## 2021-10-15 ENCOUNTER — Encounter: Payer: Medicaid Other | Admitting: Advanced Practice Midwife

## 2021-10-17 ENCOUNTER — Encounter: Payer: Self-pay | Admitting: Obstetrics and Gynecology

## 2021-11-09 ENCOUNTER — Encounter: Payer: Medicaid Other | Admitting: Family Medicine

## 2021-11-23 ENCOUNTER — Inpatient Hospital Stay (HOSPITAL_COMMUNITY)
Admission: EM | Admit: 2021-11-23 | Discharge: 2021-11-24 | Disposition: A | Discharge status: Home / Self Care | Payer: Medicaid Other | Attending: Obstetrics and Gynecology | Admitting: Obstetrics and Gynecology

## 2021-11-23 ENCOUNTER — Other Ambulatory Visit: Payer: Self-pay

## 2021-11-23 ENCOUNTER — Encounter (HOSPITAL_COMMUNITY): Payer: Self-pay | Admitting: Emergency Medicine

## 2021-11-23 DIAGNOSIS — O218 Other vomiting complicating pregnancy: Secondary | ICD-10-CM | POA: Diagnosis present

## 2021-11-23 DIAGNOSIS — E860 Dehydration: Secondary | ICD-10-CM | POA: Insufficient documentation

## 2021-11-23 DIAGNOSIS — Z3A26 26 weeks gestation of pregnancy: Secondary | ICD-10-CM | POA: Diagnosis not present

## 2021-11-23 DIAGNOSIS — O99282 Endocrine, nutritional and metabolic diseases complicating pregnancy, second trimester: Secondary | ICD-10-CM | POA: Insufficient documentation

## 2021-11-23 DIAGNOSIS — R112 Nausea with vomiting, unspecified: Secondary | ICD-10-CM | POA: Diagnosis not present

## 2021-11-23 DIAGNOSIS — E876 Hypokalemia: Secondary | ICD-10-CM | POA: Diagnosis not present

## 2021-11-23 DIAGNOSIS — A084 Viral intestinal infection, unspecified: Secondary | ICD-10-CM | POA: Diagnosis not present

## 2021-11-23 DIAGNOSIS — O219 Vomiting of pregnancy, unspecified: Secondary | ICD-10-CM | POA: Diagnosis not present

## 2021-11-23 DIAGNOSIS — R509 Fever, unspecified: Secondary | ICD-10-CM | POA: Diagnosis not present

## 2021-11-23 DIAGNOSIS — O99612 Diseases of the digestive system complicating pregnancy, second trimester: Secondary | ICD-10-CM | POA: Diagnosis not present

## 2021-11-23 DIAGNOSIS — R1031 Right lower quadrant pain: Secondary | ICD-10-CM | POA: Diagnosis not present

## 2021-11-23 DIAGNOSIS — I959 Hypotension, unspecified: Secondary | ICD-10-CM | POA: Diagnosis not present

## 2021-11-23 DIAGNOSIS — O26892 Other specified pregnancy related conditions, second trimester: Secondary | ICD-10-CM | POA: Diagnosis not present

## 2021-11-23 LAB — CBC WITH DIFFERENTIAL/PLATELET
Abs Immature Granulocytes: 0.05 10*3/uL (ref 0.00–0.07)
Basophils Absolute: 0 10*3/uL (ref 0.0–0.1)
Basophils Relative: 0 %
Eosinophils Absolute: 0 10*3/uL (ref 0.0–0.5)
Eosinophils Relative: 0 %
HCT: 32.6 % — ABNORMAL LOW (ref 36.0–46.0)
Hemoglobin: 10.9 g/dL — ABNORMAL LOW (ref 12.0–15.0)
Immature Granulocytes: 1 %
Lymphocytes Relative: 7 %
Lymphs Abs: 0.7 10*3/uL (ref 0.7–4.0)
MCH: 30.4 pg (ref 26.0–34.0)
MCHC: 33.4 g/dL (ref 30.0–36.0)
MCV: 91.1 fL (ref 80.0–100.0)
Monocytes Absolute: 0.5 10*3/uL (ref 0.1–1.0)
Monocytes Relative: 5 %
Neutro Abs: 8 10*3/uL — ABNORMAL HIGH (ref 1.7–7.7)
Neutrophils Relative %: 87 %
Platelets: 208 10*3/uL (ref 150–400)
RBC: 3.58 MIL/uL — ABNORMAL LOW (ref 3.87–5.11)
RDW: 13.6 % (ref 11.5–15.5)
WBC: 9.2 10*3/uL (ref 4.0–10.5)
nRBC: 0 % (ref 0.0–0.2)

## 2021-11-23 LAB — COMPREHENSIVE METABOLIC PANEL
ALT: 13 U/L (ref 0–44)
AST: 19 U/L (ref 15–41)
Albumin: 3.7 g/dL (ref 3.5–5.0)
Alkaline Phosphatase: 51 U/L (ref 38–126)
Anion gap: 9 (ref 5–15)
BUN: 8 mg/dL (ref 6–20)
CO2: 22 mmol/L (ref 22–32)
Calcium: 9.3 mg/dL (ref 8.9–10.3)
Chloride: 106 mmol/L (ref 98–111)
Creatinine, Ser: 0.59 mg/dL (ref 0.44–1.00)
GFR, Estimated: >60 mL/min (ref >60)
Glucose, Bld: 78 mg/dL (ref 70–99)
Potassium: 3.3 mmol/L — ABNORMAL LOW (ref 3.5–5.1)
Sodium: 137 mmol/L (ref 135–145)
Total Bilirubin: 0.8 mg/dL (ref 0.3–1.2)
Total Protein: 6.8 g/dL (ref 6.5–8.1)

## 2021-11-23 LAB — URINALYSIS, ROUTINE W REFLEX MICROSCOPIC
Bilirubin Urine: NEGATIVE
Glucose, UA: NEGATIVE mg/dL
Hgb urine dipstick: NEGATIVE
Ketones, ur: >80 mg/dL — AB
Leukocytes,Ua: NEGATIVE
Nitrite: NEGATIVE
Protein, ur: 30 mg/dL — AB
Specific Gravity, Urine: 1.025 (ref 1.005–1.030)
pH: 6 (ref 5.0–8.0)

## 2021-11-23 LAB — URINALYSIS, MICROSCOPIC (REFLEX)

## 2021-11-23 NOTE — ED Triage Notes (Signed)
Patient arrived with EMS from home reports RLQ abdominal pain with emesis and diarrhea for 3 days , she is 6 months pregnant G2P1 with regular prenatal check ups. Denies vaginal bleeding .

## 2021-11-23 NOTE — ED Notes (Signed)
EDP notified on patient's condition .

## 2021-11-23 NOTE — ED Provider Triage Note (Signed)
Emergency Medicine Provider Triage Evaluation Note  Monica Nash , a 26 y.o. female  was evaluated in triage.  Pt complains of 3 days of nausea vomiting diarrhea subjective fevers and right lower quadrant abdominal pain.  Review of Systems  Positive: Abdominal pain pregnancy Negative: Chest pain  Physical Exam  BP 109/61 (BP Location: Right Arm)   Pulse 69   Temp 98.4 F (36.9 C) (Oral)   Resp 14   LMP 05/17/2021 (Approximate)   SpO2 96%  Gen:   Awake, no distress   Resp:  Normal effort  MSK:   Moves extremities without difficulty Abdomen soft gravid Medical Decision Making  Medically screening exam initiated at 10:53 PM.  Appropriate orders placed.  Mesha Schamberger was informed that the remainder of the evaluation will be completed by another provider, this initial triage assessment does not replace that evaluation, and the importance of remaining in the ED until their evaluation is complete.  Discussed with midwife at [facility]U who accepts patient for further evaluation   Hayden Rasmussen, MD 11/23/21 2254

## 2021-11-23 NOTE — ED Notes (Signed)
Report given to MAU RN .  

## 2021-11-24 ENCOUNTER — Encounter (HOSPITAL_COMMUNITY): Payer: Self-pay | Admitting: Obstetrics and Gynecology

## 2021-11-24 DIAGNOSIS — E860 Dehydration: Secondary | ICD-10-CM

## 2021-11-24 DIAGNOSIS — E876 Hypokalemia: Secondary | ICD-10-CM

## 2021-11-24 DIAGNOSIS — A084 Viral intestinal infection, unspecified: Secondary | ICD-10-CM

## 2021-11-24 DIAGNOSIS — Z3A26 26 weeks gestation of pregnancy: Secondary | ICD-10-CM

## 2021-11-24 MED ORDER — LACTATED RINGERS IV BOLUS
1000.0000 mL | Freq: Once | INTRAVENOUS | Status: AC
  Administered 2021-11-24: 1000 mL via INTRAVENOUS

## 2021-11-24 MED ORDER — ONDANSETRON 4 MG PO TBDP
4.0000 mg | ORAL_TABLET | Freq: Three times a day (TID) | ORAL | 0 refills | Status: DC | PRN
Start: 2021-11-24 — End: 2022-06-20

## 2021-11-24 MED ORDER — ONDANSETRON HCL 4 MG/2ML IJ SOLN
4.0000 mg | Freq: Once | INTRAMUSCULAR | Status: AC
  Administered 2021-11-24: 4 mg via INTRAVENOUS
  Filled 2021-11-24: qty 2

## 2021-11-24 MED ORDER — LOPERAMIDE HCL 2 MG PO CAPS: 2.0000 mg | ORAL_CAPSULE | Freq: Four times a day (QID) | ORAL | 0 refills | Status: DC | PRN

## 2021-11-24 NOTE — MAU Provider Note (Signed)
History     [name]: [id number]  Arrival date and time: 11/23/21 2212   Event Date/Time   First Provider Initiated Contact with Patient 11/24/21 0103      26 y.o. [id number] _0 .0 wks sent from [facility] with N/V/D. Sx started 3 days ago. States her mother had the same sx. Reports feeling feverish and chills. States she cannot tolerate food or fluids. Diarrhea is watery and everytime she vomits. Reports good FM. Denies VB, LOF, or ctx.     OB History     Gravida  4   Para  1   Term  1   Preterm      AB  2   Living  1      SAB  2   IAB      Ectopic      Multiple  0   Live Births  1           Past Medical History:  Diagnosis Date   Anemia    Pregnant    S/P primary low transverse C-section 01/27/2015   UTI (urinary tract infection)     Past Surgical History:  Procedure Laterality Date   CESAREAN SECTION N/A 01/27/2015   Procedure: CESAREAN SECTION;  Surgeon: Truett Mainland, DO;  Location: [facility];  Service: Obstetrics;  Laterality: N/A;   CHOLECYSTECTOMY N/A 11/11/2017   Procedure: LAPAROSCOPIC CHOLECYSTECTOMY WITH INTRAOPERATIVE CHOLANGIOGRAM;  Surgeon: Leighton Ruff, MD;  Location: [facility];  Service: General;  Laterality: N/A;    Family History  Problem Relation Age of Onset   Cancer Maternal Grandmother    Cancer Sister     Social History   Tobacco Use   Smoking status: Never   Smokeless tobacco: Never  Vaping Use   Vaping Use: Former  Substance Use Topics   Alcohol use: No   Drug use: Not Currently    Types: Marijuana    Comment: stopped after confirmed pregnancy    Allergies: No Known Allergies  Medications Prior to Admission  Medication Sig Dispense Refill Last Dose   Prenat-Fe Poly-Methfol-FA-DHA (VITAFOL ULTRA) 29-0.6-0.4-200 MG CAPS Take 1 capsule by mouth daily. 30 capsule 11 11/23/2021   Blood Pressure Monitoring (BLOOD PRESSURE KIT) DEVI 1 kit by Does not apply route once a week. 1 each 0    Misc. Devices (GOJJI WEIGHT SCALE) MISC 1  Device by Does not apply route every 30 (thirty) days. 1 each 0    promethazine (PHENERGAN) 25 MG tablet Take 1 tablet (25 mg total) by mouth every 6 (six) hours as needed for nausea or vomiting. 30 tablet 1 More than a month    Review of Systems  Constitutional:  Positive for chills.  Gastrointestinal:  Positive for diarrhea, nausea and vomiting. Negative for abdominal pain.  Genitourinary:  Negative for vaginal bleeding.   Physical Exam   Blood pressure (!) 112/59, pulse 79, temperature 98.4 F (36.9 C), temperature source Oral, resp. rate 18, height 5' 4" (1.626 m), weight 50.2 kg, last menstrual period 05/17/2021, SpO2 96 %, unknown if currently breastfeeding.  Physical Exam Vitals and nursing note reviewed.  Constitutional:      General: She is not in acute distress.    Appearance: Normal appearance.  HENT:     Head: Normocephalic and atraumatic.  Cardiovascular:     Rate and Rhythm: Normal rate.  Pulmonary:     Effort: Pulmonary effort is normal. No respiratory distress.  Abdominal:     General: There is no distension.  Palpations: Abdomen is soft. There is no mass.     Tenderness: There is no abdominal tenderness. There is no guarding or rebound.     Hernia: No hernia is present.  Musculoskeletal:        General: Normal range of motion.     Cervical back: Normal range of motion.  Skin:    General: Skin is warm and dry.  Neurological:     General: No focal deficit present.     Mental Status: She is alert and oriented to person, place, and time.  Psychiatric:        Mood and Affect: Mood normal.        Behavior: Behavior normal.   EFM: 145 bpm, mod variability, no accels, no decels Toco: none  Results for orders placed or performed during the hospital encounter of 11/23/21 (from the past 24 hour(s))  CBC with Differential     Status: Abnormal   Collection Time: 11/23/21 10:51 PM  Result Value Ref Range   WBC 9.2 4.0 - 10.5 K/uL   RBC 3.58 (L) 3.87 - 5.11  MIL/uL   Hemoglobin 10.9 (L) 12.0 - 15.0 g/dL   HCT 32.6 (L) 36.0 - 46.0 %   MCV 91.1 80.0 - 100.0 fL   MCH 30.4 26.0 - 34.0 pg   MCHC 33.4 30.0 - 36.0 g/dL   RDW 13.6 11.5 - 15.5 %   Platelets 208 150 - 400 K/uL   nRBC 0.0 0.0 - 0.2 %   Neutrophils Relative % 87 %   Neutro Abs 8.0 (H) 1.7 - 7.7 K/uL   Lymphocytes Relative 7 %   Lymphs Abs 0.7 0.7 - 4.0 K/uL   Monocytes Relative 5 %   Monocytes Absolute 0.5 0.1 - 1.0 K/uL   Eosinophils Relative 0 %   Eosinophils Absolute 0.0 0.0 - 0.5 K/uL   Basophils Relative 0 %   Basophils Absolute 0.0 0.0 - 0.1 K/uL   Immature Granulocytes 1 %   Abs Immature Granulocytes 0.05 0.00 - 0.07 K/uL  Comprehensive metabolic panel     Status: Abnormal   Collection Time: 11/23/21 10:51 PM  Result Value Ref Range   Sodium 137 135 - 145 mmol/L   Potassium 3.3 (L) 3.5 - 5.1 mmol/L   Chloride 106 98 - 111 mmol/L   CO2 22 22 - 32 mmol/L   Glucose, Bld 78 70 - 99 mg/dL   BUN 8 6 - 20 mg/dL   Creatinine, Ser 0.59 0.44 - 1.00 mg/dL   Calcium 9.3 8.9 - 10.3 mg/dL   Total Protein 6.8 6.5 - 8.1 g/dL   Albumin 3.7 3.5 - 5.0 g/dL   AST 19 15 - 41 U/L   ALT 13 0 - 44 U/L   Alkaline Phosphatase 51 38 - 126 U/L   Total Bilirubin 0.8 0.3 - 1.2 mg/dL   GFR, Estimated >60 >60 mL/min   Anion gap 9 5 - 15  Urinalysis, Routine w reflex microscopic Urine, Clean Catch     Status: Abnormal   Collection Time: 11/23/21 11:00 PM  Result Value Ref Range   Color, Urine YELLOW YELLOW   APPearance CLEAR CLEAR   Specific Gravity, Urine 1.025 1.005 - 1.030   pH 6.0 5.0 - 8.0   Glucose, UA NEGATIVE NEGATIVE mg/dL   Hgb urine dipstick NEGATIVE NEGATIVE   Bilirubin Urine NEGATIVE NEGATIVE   Ketones, ur >80 (A) NEGATIVE mg/dL   Protein, ur 30 (A) NEGATIVE mg/dL   Nitrite NEGATIVE NEGATIVE  Leukocytes,Ua NEGATIVE NEGATIVE  Urinalysis, Microscopic (reflex)     Status: Abnormal   Collection Time: 11/23/21 11:00 PM  Result Value Ref Range   RBC / HPF 0-5 0 - 5 RBC/hpf    WBC, UA 6-10 0 - 5 WBC/hpf   Bacteria, UA RARE (A) NONE SEEN   Squamous Epithelial / LPF 6-10 0 - 5   Mucus PRESENT    MAU Course  Procedures LR Zofran  MDM Labs ordered and reviewed. Mild hypokalemia noted. Feeling better after meds and fluids, no further emesis. Stable for discharge.  Assessment and Plan   1. [redacted] weeks gestation of pregnancy   2. Viral gastroenteritis   3. Dehydration   4. Hypokalemia    Discharge home Follow up at [facility]- needs to reschedule appt Maintain hydration Return precautions  Allergies as of 11/24/2021   No Known Allergies      Medication List     TAKE these medications    Blood Pressure Kit Devi 1 kit by Does not apply route once a week.   Gojji Weight Scale Misc 1 Device by Does not apply route every 30 (thirty) days.   loperamide 2 MG capsule Commonly known as: IMODIUM Take 1 capsule (2 mg total) by mouth 4 (four) times daily as needed for diarrhea or loose stools.   ondansetron 4 MG disintegrating tablet Commonly known as: ZOFRAN-ODT Take 1 tablet (4 mg total) by mouth every 8 (eight) hours as needed for nausea or vomiting.   promethazine 25 MG tablet Commonly known as: PHENERGAN Take 1 tablet (25 mg total) by mouth every 6 (six) hours as needed for nausea or vomiting.   Vitafol Ultra 29-0.6-0.4-200 MG Caps Take 1 capsule by mouth daily.       Julianne Handler, CNM 11/24/2021, 3:02 AM

## 2021-11-24 NOTE — MAU Note (Signed)
Pt says lower abd - started Thursday  Came by EMS to [facility] ER- drew labs  and UA  Stomach Pian started Tuesday  Started vomiting on Tuesday  [employer]

## 2021-11-28 ENCOUNTER — Ambulatory Visit (INDEPENDENT_AMBULATORY_CARE_PROVIDER_SITE_OTHER): Payer: Medicaid Other | Admitting: Student

## 2021-11-28 ENCOUNTER — Encounter: Payer: Self-pay | Admitting: Student

## 2021-11-28 VITALS — BP 128/68 | HR 79 | Wt 118.4 lb

## 2021-11-28 DIAGNOSIS — Z3A26 26 weeks gestation of pregnancy: Secondary | ICD-10-CM

## 2021-11-28 DIAGNOSIS — D563 Thalassemia minor: Secondary | ICD-10-CM

## 2021-11-28 DIAGNOSIS — Z3480 Encounter for supervision of other normal pregnancy, unspecified trimester: Secondary | ICD-10-CM

## 2021-11-28 DIAGNOSIS — O0932 Supervision of pregnancy with insufficient antenatal care, second trimester: Secondary | ICD-10-CM

## 2021-11-28 NOTE — Progress Notes (Signed)
   PRENATAL VISIT NOTE  Subjective:  Monica Nash is a 26 y.o. G4P1021 at [redacted]w[redacted]d being seen today for ongoing prenatal care.  She is currently monitored for the following issues for this low-risk pregnancy and has Supervision of other normal pregnancy, antepartum and History of cesarean section on their problem list.  Patient reports no complaints.  Contractions: Not present. Vag. Bleeding: None.  Movement: Present. Denies leaking of fluid.   The following portions of the patient's history were reviewed and updated as appropriate: allergies, current medications, past family history, past medical history, past social history, past surgical history and problem list.   Objective:   Vitals:   11/28/21 1428  BP: 128/68  Pulse: 79  Weight: 118 lb 6.4 oz (53.7 kg)    Fetal Status: Fetal Heart Rate (bpm): 145 Fundal Height: 26 cm Movement: Present     General:  Alert, oriented and cooperative. Patient is in no acute distress.  Skin: Skin is warm and dry. No rash noted.   Cardiovascular: Normal heart rate noted  Respiratory: Normal respiratory effort, no problems with respiration noted  Abdomen: Soft, gravid, appropriate for gestational age.  Pain/Pressure: Present     Pelvic: Cervical exam deferred        Extremities: Normal range of motion.  Edema: None  Mental Status: Normal mood and affect. Normal behavior. Normal judgment and thought content.   Assessment and Plan:  Pregnancy: G4P1021 at [redacted]w[redacted]d 1. Supervision of other normal pregnancy, antepartum - Feeling good, no complaints - Reports frequent fetal movement  2. [redacted] weeks gestation of pregnancy - Plan for gtt at next visit  3. Limited prenatal care in second trimester - Encouraged follow up at 28 weeks  4. Alpha thalassemia silent carrier - Did not discuss partner testing at this visit  Preterm labor symptoms and general obstetric precautions including but not limited to vaginal bleeding, contractions, leaking of fluid and  fetal movement were reviewed in detail with the patient. Please refer to After Visit Summary for other counseling recommendations.   Return in about 13 days (around 12/11/2021) for LOB/GTT, IN-PERSON.  No future appointments.  Johnston Ebbs, NP

## 2021-11-28 NOTE — Progress Notes (Signed)
Patient presents for ROB visit. No concerns at this time.   

## 2021-12-10 ENCOUNTER — Ambulatory Visit (INDEPENDENT_AMBULATORY_CARE_PROVIDER_SITE_OTHER): Payer: Medicaid Other | Admitting: Obstetrics and Gynecology

## 2021-12-10 ENCOUNTER — Other Ambulatory Visit: Payer: Medicaid Other

## 2021-12-10 VITALS — BP 123/67 | HR 76 | Wt 119.8 lb

## 2021-12-10 DIAGNOSIS — D563 Thalassemia minor: Secondary | ICD-10-CM

## 2021-12-10 DIAGNOSIS — Z3483 Encounter for supervision of other normal pregnancy, third trimester: Secondary | ICD-10-CM

## 2021-12-10 DIAGNOSIS — Z3480 Encounter for supervision of other normal pregnancy, unspecified trimester: Secondary | ICD-10-CM | POA: Diagnosis not present

## 2021-12-10 DIAGNOSIS — Z23 Encounter for immunization: Secondary | ICD-10-CM | POA: Diagnosis not present

## 2021-12-10 DIAGNOSIS — Z3A28 28 weeks gestation of pregnancy: Secondary | ICD-10-CM | POA: Diagnosis not present

## 2021-12-10 NOTE — Progress Notes (Signed)
Pt reports fetal movement, denies pain.  

## 2021-12-10 NOTE — Progress Notes (Signed)
   PRENATAL VISIT NOTE  Subjective:  Monica Nash is a 26 y.o. G4P1021 at [redacted]w[redacted]d being seen today for ongoing prenatal care.  She is currently monitored for the following issues for this low-risk pregnancy and has Supervision of other normal pregnancy, antepartum and History of cesarean section on their problem list.  Patient reports no complaints.  Contractions: Not present. Vag. Bleeding: None.  Movement: Present. Denies leaking of fluid.   Patient unsure about diagnosis of alpha thal carrier and what that means.   The following portions of the patient's history were reviewed and updated as appropriate: allergies, current medications, past family history, past medical history, past social history, past surgical history and problem list.   Objective:   Vitals:   12/10/21 1054  BP: 123/67  Pulse: 76  Weight: 119 lb 12.8 oz (54.3 kg)    Fetal Status: Fetal Heart Rate (bpm): 140 Fundal Height: 28 cm Movement: Present     General:  Alert, oriented and cooperative. Patient is in no acute distress.  Skin: Skin is warm and dry. No rash noted.   Cardiovascular: Normal heart rate noted  Respiratory: Normal respiratory effort, no problems with respiration noted  Abdomen: Soft, gravid, appropriate for gestational age.  Pain/Pressure: Absent     Pelvic: Cervical exam deferred        Extremities: Normal range of motion.  Edema: Trace  Mental Status: Normal mood and affect. Normal behavior. Normal judgment and thought content.   Assessment and Plan:  Pregnancy: G4P1021 at [redacted]w[redacted]d  1. Supervision of other normal pregnancy, antepartum Discussed expectations at this GA. Opting for TOLAC for MOD if able.  - Glucose Tolerance, 2 Hours w/1 Hour - RPR - CBC - HIV antibody (with reflex)  2. [redacted] weeks gestation of pregnancy TDaP indications reviewed and patient accepted and completed today. Will follow up testing.  - Glucose Tolerance, 2 Hours w/1 Hour - RPR - CBC - HIV antibody (with  reflex)  3. Alpha thalassemia silent carrier Discussed finding and indication for partner testing (who was present today). Partner agrees to complete testing.   Preterm labor symptoms and general obstetric precautions including but not limited to vaginal bleeding, contractions, leaking of fluid and fetal movement were reviewed in detail with the patient. Please refer to After Visit Summary for other counseling recommendations.   Future Appointments  Date Time Provider [place]  12/24/2021  4:10 PM Darliss Cheney, MD [place] None  01/07/2022  4:10 PM Constant, Vickii Chafe, MD [place] None  01/21/2022  4:10 PM Griffin Basil, MD [place] None  02/04/2022  4:10 PM Constant, Vickii Chafe, MD [place] None  02/11/2022  4:10 PM Constant, Vickii Chafe, MD [place] None  02/18/2022  4:10 PM Griffin Basil, MD [place] None  02/28/2022  4:10 PM Griffin Basil, MD [place] None    Darliss Cheney, MD 12/10/21

## 2021-12-11 LAB — HIV ANTIBODY (ROUTINE TESTING W REFLEX): HIV Screen 4th Generation wRfx: NONREACTIVE

## 2021-12-11 LAB — CBC
Hematocrit: 28 % — ABNORMAL LOW (ref 34.0–46.6)
Hemoglobin: 9.2 g/dL — ABNORMAL LOW (ref 11.1–15.9)
MCH: 29.6 pg (ref 26.6–33.0)
MCHC: 32.9 g/dL (ref 31.5–35.7)
MCV: 90 fL (ref 79–97)
Platelets: 242 10*3/uL (ref 150–450)
RBC: 3.11 x10E6/uL — ABNORMAL LOW (ref 3.77–5.28)
RDW: 13.2 % (ref 11.7–15.4)
WBC: 8.7 10*3/uL (ref 3.4–10.8)

## 2021-12-11 LAB — GLUCOSE TOLERANCE, 2 HOURS W/ 1HR
Glucose, 1 hour: 101 mg/dL (ref 70–179)
Glucose, 2 hour: 79 mg/dL (ref 70–152)
Glucose, Fasting: 76 mg/dL (ref 70–91)

## 2021-12-11 LAB — RPR: RPR Ser Ql: NONREACTIVE

## 2021-12-24 ENCOUNTER — Ambulatory Visit (INDEPENDENT_AMBULATORY_CARE_PROVIDER_SITE_OTHER): Payer: Medicaid Other | Admitting: Obstetrics and Gynecology

## 2021-12-24 DIAGNOSIS — Z3480 Encounter for supervision of other normal pregnancy, unspecified trimester: Secondary | ICD-10-CM

## 2021-12-24 DIAGNOSIS — Z3483 Encounter for supervision of other normal pregnancy, third trimester: Secondary | ICD-10-CM

## 2021-12-24 DIAGNOSIS — Z3A30 30 weeks gestation of pregnancy: Secondary | ICD-10-CM

## 2021-12-24 NOTE — Patient Instructions (Signed)
I'm going to order an ultrasound to check baby's weight as baby seems to be measuring small at this time.

## 2021-12-24 NOTE — Progress Notes (Signed)
   PRENATAL VISIT NOTE  Subjective:  Monica Nash is a 26 y.o. G4P1021 at [redacted]w[redacted]d being seen today for ongoing prenatal care.  She is currently monitored for the following issues for this low-risk pregnancy and has Supervision of other normal pregnancy, antepartum and History of cesarean section on their problem list.  Patient reports no bleeding, no contractions, no cramping, and no leaking.  Contractions: Not present. Vag. Bleeding: None.  Movement: Present. Denies leaking of fluid.   The following portions of the patient's history were reviewed and updated as appropriate: allergies, current medications, past family history, past medical history, past social history, past surgical history and problem list.   Objective:   Vitals:   12/24/21 1617  BP: 117/64  Pulse: 72  Weight: 124 lb (56.2 kg)    Fetal Status: Fetal Heart Rate (bpm): 138   Movement: Present     General:  Alert, oriented and cooperative. Patient is in no acute distress.  Skin: Skin is warm and dry. No rash noted.   Cardiovascular: Normal heart rate noted  Respiratory: Normal respiratory effort, no problems with respiration noted  Abdomen: Soft, gravid, appropriate for gestational age.  Pain/Pressure: Absent     Pelvic: Cervical exam deferred        Extremities: Normal range of motion.  Edema: Trace  Mental Status: Normal mood and affect. Normal behavior. Normal judgment and thought content.   Assessment and Plan:  Pregnancy: G4P1021 at [redacted]w[redacted]d 1. Supervision of other normal pregnancy, antepartum Discussed upcoming testing Measuring small today, ordered for interval growth ultrasound to assess for growth restriction   Preterm labor symptoms and general obstetric precautions including but not limited to vaginal bleeding, contractions, leaking of fluid and fetal movement were reviewed in detail with the patient. Please refer to After Visit Summary for other counseling recommendations.   No follow-ups on  file.  Future Appointments  Date Time Provider [place]  01/07/2022  4:10 PM Constant, Vickii Chafe, MD [place] None  01/21/2022  4:10 PM Griffin Basil, MD [facility] None  02/04/2022  4:10 PM Constant, Vickii Chafe, MD [place] None  02/11/2022  4:10 PM Constant, Vickii Chafe, MD [place] None  02/18/2022  4:10 PM Griffin Basil, MD [place] None  02/28/2022  4:10 PM Griffin Basil, MD [place] None    Darliss Cheney, MD

## 2021-12-24 NOTE — Progress Notes (Signed)
Pt reports fetal movement, denies pain.  

## 2022-01-01 ENCOUNTER — Ambulatory Visit (HOSPITAL_BASED_OUTPATIENT_CLINIC_OR_DEPARTMENT_OTHER): Payer: Medicaid Other | Admitting: *Deleted

## 2022-01-01 ENCOUNTER — Ambulatory Visit: Payer: Medicaid Other | Admitting: *Deleted

## 2022-01-01 ENCOUNTER — Ambulatory Visit: Payer: Medicaid Other | Attending: Obstetrics and Gynecology

## 2022-01-01 ENCOUNTER — Other Ambulatory Visit: Payer: Self-pay | Admitting: Obstetrics and Gynecology

## 2022-01-01 ENCOUNTER — Encounter: Payer: Self-pay | Admitting: *Deleted

## 2022-01-01 VITALS — BP 120/63 | HR 72

## 2022-01-01 DIAGNOSIS — O34219 Maternal care for unspecified type scar from previous cesarean delivery: Secondary | ICD-10-CM | POA: Diagnosis not present

## 2022-01-01 DIAGNOSIS — Z148 Genetic carrier of other disease: Secondary | ICD-10-CM | POA: Diagnosis not present

## 2022-01-01 DIAGNOSIS — O365930 Maternal care for other known or suspected poor fetal growth, third trimester, not applicable or unspecified: Secondary | ICD-10-CM | POA: Insufficient documentation

## 2022-01-01 DIAGNOSIS — Z3480 Encounter for supervision of other normal pregnancy, unspecified trimester: Secondary | ICD-10-CM

## 2022-01-01 DIAGNOSIS — O365931 Maternal care for other known or suspected poor fetal growth, third trimester, fetus 1: Secondary | ICD-10-CM

## 2022-01-01 DIAGNOSIS — Z3A31 31 weeks gestation of pregnancy: Secondary | ICD-10-CM | POA: Insufficient documentation

## 2022-01-01 NOTE — Procedures (Signed)
Mikhaela Zaugg 10-22-1995 [redacted]w[redacted]d  Fetus A Non-Stress Test Interpretation for 01/01/22  Indication: IUGR  Fetal Heart Rate A Mode: External Baseline Rate (A): 135 bpm Variability: Moderate Accelerations: 15 x 15 Decelerations: Variable Multiple birth?: No  Uterine Activity Mode: Toco Contraction Frequency (min): 2-7 Contraction Duration (sec): 50-100 Contraction Quality: Mild Resting Tone Palpated: Relaxed  Interpretation (Fetal Testing) Nonstress Test Interpretation: Reactive Overall Impression: Reassuring for gestational age Comments: tracing reviewed by Dr. Gertie Exon

## 2022-01-02 ENCOUNTER — Other Ambulatory Visit: Payer: Self-pay | Admitting: *Deleted

## 2022-01-02 ENCOUNTER — Encounter: Payer: Self-pay | Admitting: Obstetrics and Gynecology

## 2022-01-02 DIAGNOSIS — O365930 Maternal care for other known or suspected poor fetal growth, third trimester, not applicable or unspecified: Secondary | ICD-10-CM

## 2022-01-02 DIAGNOSIS — O365990 Maternal care for other known or suspected poor fetal growth, unspecified trimester, not applicable or unspecified: Secondary | ICD-10-CM | POA: Insufficient documentation

## 2022-01-07 ENCOUNTER — Encounter: Payer: Medicaid Other | Admitting: Obstetrics and Gynecology

## 2022-01-08 ENCOUNTER — Ambulatory Visit: Payer: Medicaid Other | Admitting: *Deleted

## 2022-01-08 ENCOUNTER — Encounter: Payer: Self-pay | Admitting: *Deleted

## 2022-01-08 ENCOUNTER — Ambulatory Visit: Payer: Medicaid Other | Attending: Maternal & Fetal Medicine

## 2022-01-08 VITALS — BP 130/66 | HR 56

## 2022-01-08 DIAGNOSIS — O34219 Maternal care for unspecified type scar from previous cesarean delivery: Secondary | ICD-10-CM | POA: Insufficient documentation

## 2022-01-08 DIAGNOSIS — O365920 Maternal care for other known or suspected poor fetal growth, second trimester, not applicable or unspecified: Secondary | ICD-10-CM | POA: Insufficient documentation

## 2022-01-08 DIAGNOSIS — Z3A32 32 weeks gestation of pregnancy: Secondary | ICD-10-CM | POA: Insufficient documentation

## 2022-01-08 DIAGNOSIS — Z148 Genetic carrier of other disease: Secondary | ICD-10-CM | POA: Diagnosis not present

## 2022-01-08 DIAGNOSIS — Z3480 Encounter for supervision of other normal pregnancy, unspecified trimester: Secondary | ICD-10-CM | POA: Insufficient documentation

## 2022-01-08 DIAGNOSIS — O365930 Maternal care for other known or suspected poor fetal growth, third trimester, not applicable or unspecified: Secondary | ICD-10-CM

## 2022-01-14 ENCOUNTER — Ambulatory Visit: Payer: Medicaid Other | Attending: Maternal & Fetal Medicine

## 2022-01-14 ENCOUNTER — Ambulatory Visit: Payer: Medicaid Other | Admitting: *Deleted

## 2022-01-14 ENCOUNTER — Encounter: Payer: Self-pay | Admitting: *Deleted

## 2022-01-14 VITALS — BP 124/64 | HR 71

## 2022-01-14 DIAGNOSIS — Z3480 Encounter for supervision of other normal pregnancy, unspecified trimester: Secondary | ICD-10-CM | POA: Insufficient documentation

## 2022-01-14 DIAGNOSIS — O365930 Maternal care for other known or suspected poor fetal growth, third trimester, not applicable or unspecified: Secondary | ICD-10-CM

## 2022-01-14 DIAGNOSIS — D563 Thalassemia minor: Secondary | ICD-10-CM | POA: Diagnosis not present

## 2022-01-14 DIAGNOSIS — O99013 Anemia complicating pregnancy, third trimester: Secondary | ICD-10-CM | POA: Diagnosis not present

## 2022-01-14 DIAGNOSIS — Z3A33 33 weeks gestation of pregnancy: Secondary | ICD-10-CM | POA: Diagnosis not present

## 2022-01-14 DIAGNOSIS — O34219 Maternal care for unspecified type scar from previous cesarean delivery: Secondary | ICD-10-CM | POA: Diagnosis not present

## 2022-01-14 DIAGNOSIS — D560 Alpha thalassemia: Secondary | ICD-10-CM | POA: Insufficient documentation

## 2022-01-14 DIAGNOSIS — O285 Abnormal chromosomal and genetic finding on antenatal screening of mother: Secondary | ICD-10-CM

## 2022-01-21 ENCOUNTER — Ambulatory Visit (INDEPENDENT_AMBULATORY_CARE_PROVIDER_SITE_OTHER): Payer: Medicaid Other | Admitting: Obstetrics and Gynecology

## 2022-01-21 VITALS — BP 108/66 | HR 73 | Wt 129.0 lb

## 2022-01-21 DIAGNOSIS — O365931 Maternal care for other known or suspected poor fetal growth, third trimester, fetus 1: Secondary | ICD-10-CM

## 2022-01-21 DIAGNOSIS — O365990 Maternal care for other known or suspected poor fetal growth, unspecified trimester, not applicable or unspecified: Secondary | ICD-10-CM

## 2022-01-21 DIAGNOSIS — Z98891 History of uterine scar from previous surgery: Secondary | ICD-10-CM

## 2022-01-21 DIAGNOSIS — Z3A34 34 weeks gestation of pregnancy: Secondary | ICD-10-CM

## 2022-01-21 DIAGNOSIS — Z3480 Encounter for supervision of other normal pregnancy, unspecified trimester: Secondary | ICD-10-CM

## 2022-01-21 NOTE — Progress Notes (Signed)
   PRENATAL VISIT NOTE  Subjective:  Monica Nash is a 26 y.o. G4P1021 at [redacted]w[redacted]d being seen today for ongoing prenatal care.  She is currently monitored for the following issues for this high-risk pregnancy and has Supervision of other normal pregnancy, antepartum; History of cesarean section; and Fetal growth restriction antepartum on their problem list.  Patient doing well with no acute concerns today. She reports no complaints.  Contractions: Irregular. Vag. Bleeding: None.  Movement: Present. Denies leaking of fluid.   The following portions of the patient's history were reviewed and updated as appropriate: allergies, current medications, past family history, past medical history, past social history, past surgical history and problem list. Problem list updated.  Objective:   Vitals:   01/21/22 1616  BP: 108/66  Pulse: 73  Weight: 129 lb (58.5 kg)    Fetal Status: Fetal Heart Rate (bpm): 140 Fundal Height: 32 cm Movement: Present     General:  Alert, oriented and cooperative. Patient is in no acute distress.  Skin: Skin is warm and dry. No rash noted.   Cardiovascular: Normal heart rate noted  Respiratory: Normal respiratory effort, no problems with respiration noted  Abdomen: Soft, gravid, appropriate for gestational age.  Pain/Pressure: Absent     Pelvic: Cervical exam deferred        Extremities: Normal range of motion.  Edema: Trace  Mental Status:  Normal mood and affect. Normal behavior. Normal judgment and thought content.   Assessment and Plan:  Pregnancy: G4P1021 at [redacted]w[redacted]d  1. Supervision of other normal pregnancy, antepartum Continue routine prenatal care 36 weeks swabs at next visit  2. [redacted] weeks gestation of pregnancy   3. History of cesarean section Pt deciding on c/s vs vaginal delivery.  Risk and benefit sheet given. Some decision making may be contingent on growth scan on 01/22/22 regarding timing and route of delivery  4. Fetal growth restriction  antepartum Last EFW was at 7%, growth scan on 01/22/22  Preterm labor symptoms and general obstetric precautions including but not limited to vaginal bleeding, contractions, leaking of fluid and fetal movement were reviewed in detail with the patient.  Please refer to After Visit Summary for other counseling recommendations.   Return in about 2 weeks (around 02/04/2022) for [facility], in person, 36 weeks swabs.   Mariel Aloe, MD Faculty Attending [facility]

## 2022-01-21 NOTE — Progress Notes (Signed)
ROB c/o pressure when she moves, irregular contractions.

## 2022-01-22 ENCOUNTER — Other Ambulatory Visit: Payer: Self-pay | Admitting: *Deleted

## 2022-01-22 ENCOUNTER — Encounter: Payer: Self-pay | Admitting: *Deleted

## 2022-01-22 ENCOUNTER — Ambulatory Visit: Payer: Medicaid Other | Attending: Maternal & Fetal Medicine

## 2022-01-22 ENCOUNTER — Ambulatory Visit: Payer: Medicaid Other | Admitting: *Deleted

## 2022-01-22 VITALS — BP 114/56 | HR 67

## 2022-01-22 DIAGNOSIS — Z362 Encounter for other antenatal screening follow-up: Secondary | ICD-10-CM | POA: Diagnosis not present

## 2022-01-22 DIAGNOSIS — Z3480 Encounter for supervision of other normal pregnancy, unspecified trimester: Secondary | ICD-10-CM

## 2022-01-22 DIAGNOSIS — Z148 Genetic carrier of other disease: Secondary | ICD-10-CM | POA: Insufficient documentation

## 2022-01-22 DIAGNOSIS — O365930 Maternal care for other known or suspected poor fetal growth, third trimester, not applicable or unspecified: Secondary | ICD-10-CM

## 2022-01-22 DIAGNOSIS — O34219 Maternal care for unspecified type scar from previous cesarean delivery: Secondary | ICD-10-CM | POA: Insufficient documentation

## 2022-01-22 DIAGNOSIS — Z3A34 34 weeks gestation of pregnancy: Secondary | ICD-10-CM | POA: Insufficient documentation

## 2022-01-22 DIAGNOSIS — O99891 Other specified diseases and conditions complicating pregnancy: Secondary | ICD-10-CM | POA: Diagnosis not present

## 2022-01-29 ENCOUNTER — Ambulatory Visit: Payer: Medicaid Other

## 2022-01-29 ENCOUNTER — Ambulatory Visit: Payer: Medicaid Other | Attending: Maternal & Fetal Medicine

## 2022-01-29 VITALS — BP 124/62 | HR 57

## 2022-01-29 DIAGNOSIS — Z3480 Encounter for supervision of other normal pregnancy, unspecified trimester: Secondary | ICD-10-CM | POA: Diagnosis present

## 2022-01-29 DIAGNOSIS — O99891 Other specified diseases and conditions complicating pregnancy: Secondary | ICD-10-CM

## 2022-01-29 DIAGNOSIS — O34219 Maternal care for unspecified type scar from previous cesarean delivery: Secondary | ICD-10-CM | POA: Insufficient documentation

## 2022-01-29 DIAGNOSIS — O365930 Maternal care for other known or suspected poor fetal growth, third trimester, not applicable or unspecified: Secondary | ICD-10-CM | POA: Diagnosis not present

## 2022-01-29 DIAGNOSIS — D560 Alpha thalassemia: Secondary | ICD-10-CM | POA: Insufficient documentation

## 2022-01-29 DIAGNOSIS — Z148 Genetic carrier of other disease: Secondary | ICD-10-CM

## 2022-01-29 DIAGNOSIS — Z362 Encounter for other antenatal screening follow-up: Secondary | ICD-10-CM | POA: Insufficient documentation

## 2022-01-29 DIAGNOSIS — Z3A35 35 weeks gestation of pregnancy: Secondary | ICD-10-CM | POA: Insufficient documentation

## 2022-02-04 ENCOUNTER — Encounter: Payer: Medicaid Other | Admitting: Obstetrics and Gynecology

## 2022-02-05 ENCOUNTER — Ambulatory Visit: Payer: Medicaid Other | Admitting: *Deleted

## 2022-02-05 ENCOUNTER — Ambulatory Visit: Payer: Medicaid Other | Attending: Maternal & Fetal Medicine

## 2022-02-05 ENCOUNTER — Encounter: Payer: Self-pay | Admitting: *Deleted

## 2022-02-05 ENCOUNTER — Other Ambulatory Visit: Payer: Self-pay | Admitting: *Deleted

## 2022-02-05 VITALS — BP 129/66 | HR 70

## 2022-02-05 DIAGNOSIS — Z3A36 36 weeks gestation of pregnancy: Secondary | ICD-10-CM | POA: Insufficient documentation

## 2022-02-05 DIAGNOSIS — O34219 Maternal care for unspecified type scar from previous cesarean delivery: Secondary | ICD-10-CM | POA: Insufficient documentation

## 2022-02-05 DIAGNOSIS — Z148 Genetic carrier of other disease: Secondary | ICD-10-CM | POA: Diagnosis not present

## 2022-02-05 DIAGNOSIS — Z3480 Encounter for supervision of other normal pregnancy, unspecified trimester: Secondary | ICD-10-CM

## 2022-02-05 DIAGNOSIS — O99891 Other specified diseases and conditions complicating pregnancy: Secondary | ICD-10-CM | POA: Diagnosis not present

## 2022-02-05 DIAGNOSIS — O365930 Maternal care for other known or suspected poor fetal growth, third trimester, not applicable or unspecified: Secondary | ICD-10-CM

## 2022-02-11 ENCOUNTER — Ambulatory Visit (INDEPENDENT_AMBULATORY_CARE_PROVIDER_SITE_OTHER): Payer: Medicaid Other | Admitting: Obstetrics and Gynecology

## 2022-02-11 ENCOUNTER — Encounter: Payer: Self-pay | Admitting: Obstetrics and Gynecology

## 2022-02-11 ENCOUNTER — Other Ambulatory Visit (HOSPITAL_COMMUNITY)
Admission: RE | Admit: 2022-02-11 | Discharge: 2022-02-11 | Disposition: A | Discharge status: Home / Self Care | Payer: Medicaid Other | Source: Ambulatory Visit | Attending: Obstetrics and Gynecology | Admitting: Obstetrics and Gynecology

## 2022-02-11 VITALS — BP 116/68 | HR 70 | Wt 132.0 lb

## 2022-02-11 DIAGNOSIS — O365990 Maternal care for other known or suspected poor fetal growth, unspecified trimester, not applicable or unspecified: Secondary | ICD-10-CM

## 2022-02-11 DIAGNOSIS — Z3480 Encounter for supervision of other normal pregnancy, unspecified trimester: Secondary | ICD-10-CM

## 2022-02-11 DIAGNOSIS — Z3483 Encounter for supervision of other normal pregnancy, third trimester: Secondary | ICD-10-CM

## 2022-02-11 DIAGNOSIS — Z98891 History of uterine scar from previous surgery: Secondary | ICD-10-CM

## 2022-02-11 DIAGNOSIS — O365930 Maternal care for other known or suspected poor fetal growth, third trimester, not applicable or unspecified: Secondary | ICD-10-CM

## 2022-02-11 DIAGNOSIS — Z3A37 37 weeks gestation of pregnancy: Secondary | ICD-10-CM

## 2022-02-11 NOTE — Progress Notes (Signed)
   PRENATAL VISIT NOTE  Subjective:  Monica Nash is a 26 y.o. G4P1021 at [redacted]w[redacted]d being seen today for ongoing prenatal care.  She is currently monitored for the following issues for this high-risk pregnancy and has Supervision of other normal pregnancy, antepartum; History of cesarean section; and Fetal growth restriction antepartum on their problem list.  Patient reports no complaints.  Contractions: Irregular. Vag. Bleeding: None.  Movement: Present. Denies leaking of fluid.   The following portions of the patient's history were reviewed and updated as appropriate: allergies, current medications, past family history, past medical history, past social history, past surgical history and problem list.   Objective:   Vitals:   02/11/22 1616  BP: 116/68  Pulse: 70  Weight: 132 lb (59.9 kg)    Fetal Status: Fetal Heart Rate (bpm): 147 Fundal Height: 35 cm Movement: Present     General:  Alert, oriented and cooperative. Patient is in no acute distress.  Skin: Skin is warm and dry. No rash noted.   Cardiovascular: Normal heart rate noted  Respiratory: Normal respiratory effort, no problems with respiration noted  Abdomen: Soft, gravid, appropriate for gestational age.  Pain/Pressure: Present     Pelvic: Cervical exam performed in the presence of a chaperone Dilation: Closed Effacement (%): Thick Station: Ballotable  Extremities: Normal range of motion.  Edema: Trace  Mental Status: Normal mood and affect. Normal behavior. Normal judgment and thought content.   Assessment and Plan:  Pregnancy: G4P1021 at [redacted]w[redacted]d 1. Supervision of other normal pregnancy, antepartum Patient is doing well without complaints Vaginal cultures collected Patient plans depo-provera for contraception  2. History of cesarean section Primary cesarean section for fetal malpresentation Reviewed risks and benefits of TOLAC vs RCS. Patient opted for [facility] Consent signed today  3. Fetal growth restriction  antepartum Follow up growth ultrasound tomorrow Timing of delivery to be determined tomorrow  Term labor symptoms and general obstetric precautions including but not limited to vaginal bleeding, contractions, leaking of fluid and fetal movement were reviewed in detail with the patient. Please refer to After Visit Summary for other counseling recommendations.   Return in about 1 week (around 02/18/2022) for in person, ROB, High risk.  Future Appointments  Date Time Provider Department Center  02/12/2022  1:45 PM [facility] NURSE [facility]  02/12/2022  2:00 PM [facility] US1 [facility]  02/18/2022  4:10 PM Warden Fillers, MD [facility] None  02/19/2022 12:30 PM [facility] NURSE [facility]  02/19/2022 12:45 PM [facility] US4 [facility]  02/28/2022  4:10 PM Warden Fillers, MD [facility] None    Catalina Antigua, MD

## 2022-02-11 NOTE — Progress Notes (Signed)
Pt reports fetal movement with some contractions. 

## 2022-02-12 ENCOUNTER — Ambulatory Visit: Payer: Medicaid Other | Admitting: *Deleted

## 2022-02-12 ENCOUNTER — Other Ambulatory Visit: Payer: Self-pay | Admitting: Obstetrics and Gynecology

## 2022-02-12 ENCOUNTER — Encounter: Payer: Self-pay | Admitting: *Deleted

## 2022-02-12 ENCOUNTER — Telehealth: Payer: Self-pay | Admitting: Obstetrics and Gynecology

## 2022-02-12 ENCOUNTER — Ambulatory Visit: Payer: Medicaid Other | Attending: Obstetrics | Admitting: Obstetrics

## 2022-02-12 ENCOUNTER — Ambulatory Visit (HOSPITAL_BASED_OUTPATIENT_CLINIC_OR_DEPARTMENT_OTHER): Payer: Medicaid Other

## 2022-02-12 VITALS — BP 123/65 | HR 66

## 2022-02-12 DIAGNOSIS — Z3A37 37 weeks gestation of pregnancy: Secondary | ICD-10-CM

## 2022-02-12 DIAGNOSIS — Z3480 Encounter for supervision of other normal pregnancy, unspecified trimester: Secondary | ICD-10-CM | POA: Insufficient documentation

## 2022-02-12 DIAGNOSIS — O285 Abnormal chromosomal and genetic finding on antenatal screening of mother: Secondary | ICD-10-CM | POA: Diagnosis not present

## 2022-02-12 DIAGNOSIS — O365930 Maternal care for other known or suspected poor fetal growth, third trimester, not applicable or unspecified: Secondary | ICD-10-CM

## 2022-02-12 DIAGNOSIS — D563 Thalassemia minor: Secondary | ICD-10-CM | POA: Diagnosis not present

## 2022-02-12 DIAGNOSIS — O34219 Maternal care for unspecified type scar from previous cesarean delivery: Secondary | ICD-10-CM

## 2022-02-12 LAB — CERVICOVAGINAL ANCILLARY ONLY
Chlamydia: NEGATIVE
Comment: NEGATIVE
Comment: NORMAL
Neisseria Gonorrhea: NEGATIVE

## 2022-02-12 NOTE — Telephone Encounter (Signed)
Discussed IOL at [facility] tonight. Reviewed arrival time. Reviewed foley bulb and pitocin. Reviewed expectation of prolonged process.   Prior c-section in 2016 was for breech with Dr. Shawnie Pons.   Milas Hock, MD Attending Obstetrician & Gynecologist, [facility] for [facility], [facility]

## 2022-02-12 NOTE — Progress Notes (Signed)
MFM Note  Monica Nash has been followed due to IUGR.  She denies any problems since her last exam and reports feeling vigorous fetal movements throughout the day.    On today's exam, the EFW of 5 pounds 4 ounces  measures at the 3rd percentile for her gestational age indicating severe IUGR.   The total AFI was 11.4 cm (within normal limits).  A BPP performed today was 8 out of 8.    Doppler studies of the umbilical arteries showed a normal S/D ratio of 2.8 .  There were no signs of absent or reversed end-diastolic flow.    Due to severe IUGR at her current gestational age, delivery is recommended.    I discussed her case with the second attending.  She will schedule the patient for induction within the next few days.    The patient is happy with the plan for delivery soon.    She stated that all of her questions were answered today.  A total of 20 minutes was spent counseling and coordinating the care for this patient.  Greater than 50% of the time was spent in direct face-to-face contact.

## 2022-02-13 ENCOUNTER — Encounter (HOSPITAL_COMMUNITY): Payer: Self-pay | Admitting: Obstetrics and Gynecology

## 2022-02-13 ENCOUNTER — Inpatient Hospital Stay (HOSPITAL_COMMUNITY): Payer: Medicaid Other | Admitting: Anesthesiology

## 2022-02-13 ENCOUNTER — Inpatient Hospital Stay (HOSPITAL_COMMUNITY)
Admission: RE | Admit: 2022-02-13 | Discharge: 2022-02-15 | DRG: 807 | Disposition: A | Discharge status: Home / Self Care | Payer: Medicaid Other | Attending: Obstetrics & Gynecology | Admitting: Obstetrics & Gynecology

## 2022-02-13 ENCOUNTER — Inpatient Hospital Stay (HOSPITAL_COMMUNITY): Payer: Medicaid Other

## 2022-02-13 DIAGNOSIS — Y93F1 Activity, caregiving, bathing: Secondary | ICD-10-CM

## 2022-02-13 DIAGNOSIS — Z3A37 37 weeks gestation of pregnancy: Secondary | ICD-10-CM | POA: Diagnosis not present

## 2022-02-13 DIAGNOSIS — D649 Anemia, unspecified: Secondary | ICD-10-CM | POA: Diagnosis not present

## 2022-02-13 DIAGNOSIS — W1839XA Other fall on same level, initial encounter: Secondary | ICD-10-CM | POA: Diagnosis not present

## 2022-02-13 DIAGNOSIS — O34219 Maternal care for unspecified type scar from previous cesarean delivery: Secondary | ICD-10-CM | POA: Diagnosis present

## 2022-02-13 DIAGNOSIS — O365990 Maternal care for other known or suspected poor fetal growth, unspecified trimester, not applicable or unspecified: Secondary | ICD-10-CM | POA: Diagnosis present

## 2022-02-13 DIAGNOSIS — Y92231 Patient bathroom in hospital as the place of occurrence of the external cause: Secondary | ICD-10-CM | POA: Diagnosis not present

## 2022-02-13 DIAGNOSIS — O365930 Maternal care for other known or suspected poor fetal growth, third trimester, not applicable or unspecified: Secondary | ICD-10-CM | POA: Diagnosis not present

## 2022-02-13 DIAGNOSIS — Z98891 History of uterine scar from previous surgery: Secondary | ICD-10-CM

## 2022-02-13 DIAGNOSIS — O34211 Maternal care for low transverse scar from previous cesarean delivery: Secondary | ICD-10-CM | POA: Diagnosis not present

## 2022-02-13 DIAGNOSIS — F17210 Nicotine dependence, cigarettes, uncomplicated: Secondary | ICD-10-CM | POA: Diagnosis present

## 2022-02-13 DIAGNOSIS — D62 Acute posthemorrhagic anemia: Secondary | ICD-10-CM

## 2022-02-13 DIAGNOSIS — Z3480 Encounter for supervision of other normal pregnancy, unspecified trimester: Secondary | ICD-10-CM

## 2022-02-13 DIAGNOSIS — O99334 Smoking (tobacco) complicating childbirth: Secondary | ICD-10-CM | POA: Diagnosis present

## 2022-02-13 DIAGNOSIS — O9902 Anemia complicating childbirth: Secondary | ICD-10-CM | POA: Diagnosis not present

## 2022-02-13 LAB — CBC
HCT: 25.4 % — ABNORMAL LOW (ref 36.0–46.0)
Hemoglobin: 8.9 g/dL — ABNORMAL LOW (ref 12.0–15.0)
MCH: 30.9 pg (ref 26.0–34.0)
MCHC: 35 g/dL (ref 30.0–36.0)
MCV: 88.2 fL (ref 80.0–100.0)
Platelets: 226 10*3/uL (ref 150–400)
RBC: 2.88 MIL/uL — ABNORMAL LOW (ref 3.87–5.11)
RDW: 14.6 % (ref 11.5–15.5)
WBC: 7.9 10*3/uL (ref 4.0–10.5)
nRBC: 0 % (ref 0.0–0.2)

## 2022-02-13 LAB — TYPE AND SCREEN
ABO/RH(D): A POS
Antibody Screen: NEGATIVE

## 2022-02-13 LAB — GROUP B STREP BY PCR: Group B strep by PCR: NEGATIVE

## 2022-02-13 LAB — RPR: RPR Ser Ql: NONREACTIVE

## 2022-02-13 MED ORDER — EPHEDRINE 5 MG/ML INJ: 10.0000 mg | INTRAVENOUS | Status: DC | PRN

## 2022-02-13 MED ORDER — FENTANYL-BUPIVACAINE-NACL 0.5-0.125-0.9 MG/250ML-% EP SOLN
12.0000 mL/h | EPIDURAL | Status: DC | PRN
  Administered 2022-02-13: 12 mL/h via EPIDURAL
  Filled 2022-02-13: qty 250

## 2022-02-13 MED ORDER — TERBUTALINE SULFATE 1 MG/ML IJ SOLN: 0.2500 mg | Freq: Once | INTRAMUSCULAR | Status: DC | PRN

## 2022-02-13 MED ORDER — OXYTOCIN-SODIUM CHLORIDE 30-0.9 UT/500ML-% IV SOLN: 2.5000 [IU]/h | INTRAVENOUS | Status: DC

## 2022-02-13 MED ORDER — LACTATED RINGERS IV SOLN: INTRAVENOUS | Status: DC

## 2022-02-13 MED ORDER — FENTANYL CITRATE (PF) 100 MCG/2ML IJ SOLN
50.0000 ug | INTRAMUSCULAR | Status: DC | PRN
  Administered 2022-02-13 (×4): 100 ug via INTRAVENOUS
  Filled 2022-02-13 (×4): qty 2

## 2022-02-13 MED ORDER — OXYCODONE-ACETAMINOPHEN 5-325 MG PO TABS: 2.0000 | ORAL_TABLET | ORAL | Status: DC | PRN

## 2022-02-13 MED ORDER — DIPHENHYDRAMINE HCL 50 MG/ML IJ SOLN: 12.5000 mg | INTRAMUSCULAR | Status: DC | PRN

## 2022-02-13 MED ORDER — OXYTOCIN BOLUS FROM INFUSION
333.0000 mL | Freq: Once | INTRAVENOUS | Status: AC
  Administered 2022-02-13: 333 mL via INTRAVENOUS

## 2022-02-13 MED ORDER — OXYCODONE-ACETAMINOPHEN 5-325 MG PO TABS: 1.0000 | ORAL_TABLET | ORAL | Status: DC | PRN

## 2022-02-13 MED ORDER — PHENYLEPHRINE 80 MCG/ML (10ML) SYRINGE FOR IV PUSH (FOR BLOOD PRESSURE SUPPORT): 80.0000 ug | PREFILLED_SYRINGE | INTRAVENOUS | Status: DC | PRN

## 2022-02-13 MED ORDER — ONDANSETRON HCL 4 MG/2ML IJ SOLN
4.0000 mg | Freq: Four times a day (QID) | INTRAMUSCULAR | Status: DC | PRN
  Administered 2022-02-13: 4 mg via INTRAVENOUS
  Filled 2022-02-13: qty 2

## 2022-02-13 MED ORDER — FLEET ENEMA 7-19 GM/118ML RE ENEM: 1.0000 | ENEMA | RECTAL | Status: DC | PRN

## 2022-02-13 MED ORDER — OXYTOCIN-SODIUM CHLORIDE 30-0.9 UT/500ML-% IV SOLN
1.0000 m[IU]/min | INTRAVENOUS | Status: DC
  Administered 2022-02-13: 1 m[IU]/min via INTRAVENOUS
  Filled 2022-02-13: qty 500

## 2022-02-13 MED ORDER — OXYTOCIN-SODIUM CHLORIDE 30-0.9 UT/500ML-% IV SOLN: 1.0000 m[IU]/min | INTRAVENOUS | Status: DC

## 2022-02-13 MED ORDER — LACTATED RINGERS IV SOLN
500.0000 mL | INTRAVENOUS | Status: DC | PRN
  Administered 2022-02-13: 500 mL via INTRAVENOUS

## 2022-02-13 MED ORDER — LIDOCAINE HCL (PF) 1 % IJ SOLN
INTRAMUSCULAR | Status: DC | PRN
  Administered 2022-02-13: 8 mL via EPIDURAL

## 2022-02-13 MED ORDER — SOD CITRATE-CITRIC ACID 500-334 MG/5ML PO SOLN: 30.0000 mL | ORAL | Status: DC | PRN

## 2022-02-13 MED ORDER — LIDOCAINE HCL (PF) 1 % IJ SOLN: 30.0000 mL | INTRAMUSCULAR | Status: DC | PRN

## 2022-02-13 MED ORDER — ACETAMINOPHEN 325 MG PO TABS: 650.0000 mg | ORAL_TABLET | ORAL | Status: DC | PRN

## 2022-02-13 MED ORDER — LACTATED RINGERS IV SOLN: 500.0000 mL | Freq: Once | INTRAVENOUS | Status: DC

## 2022-02-13 NOTE — Progress Notes (Signed)
Monica Nash is a 26 y.o. H6O3729 at [redacted]w[redacted]d by ultrasound admitted for induction of labor due to Poor fetal growth 3rd %tile as a TOLAC. Marland Kitchen  Subjective: Patient doing well.   Objective: BP 120/63   Pulse (!) 58   Temp 98.2 F (36.8 C) (Oral)   Resp 16   Ht 5\' 4"  (1.626 m)   Wt 60.3 kg   LMP 05/17/2021 (Approximate)   BMI 22.83 kg/m  No intake/output data recorded. No intake/output data recorded.  FHT:  FHR: 125 bpm, variability: moderate,  accelerations:  Present,  decelerations:  Absent UC:   regular, every 3-5 minutes SVE:   Dilation: 5.5 Effacement (%): 70 Station: Ballotable Exam by:: [email or web address], CNM  Labs: Lab Results  Component Value Date   WBC 7.9 02/13/2022   HGB 8.9 (L) 02/13/2022   HCT 25.4 (L) 02/13/2022   MCV 88.2 02/13/2022   PLT 226 02/13/2022    Assessment / Plan: Induction of labor due to IUGR,  progressing well on pitocin  Labor: Progressing on Pitocin. FB expelled at 1625. Fetal head well applied to cervix. AROM performed. Pink tinged fluid noted.  Fetal Wellbeing:  Category I Pain Control:  IV pain meds and Patient may have epidural upon request.  I/D:   GBS Negative  Anticipated MOD:   TOLAC with successful VBAC.   02/15/2022, CNM 02/13/2022, 5:41 PM

## 2022-02-13 NOTE — H&P (Signed)
OBSTETRIC ADMISSION HISTORY AND PHYSICAL  Monica Nash is a 26 y.o. female [phone] with IUP at 40w4dby 8 week [place] for scheduled IOL due to severe FGR. She reports +FMs, No LOF, no VB, no blurry vision, headaches or peripheral edema, and RUQ pain.  She plans on breast feeding. She request depo for birth control. [facility] Dating: By 8 week [place]--->  Estimated Date of Delivery: 03/02/22  Sono:    _0 , CWD, normal anatomy, cephalic presentation, posterior placenta, 2383g, 3.1% EFW   Prenatal History/Complications:  Principal Problem:   Fetal growth restriction antepartum Active Problems:   Supervision of other normal pregnancy, antepartum   History of cesarean section   Indication for care in labor and delivery, antepartum  Nursing Staff Provider  Office Location  [facility] Dating    [facility] Traditional _1  Centering _2  Mom-Baby [facility]  Ultrasound   Language   English Anatomy [place] 10/08/21, normal  Flu Vaccine   Declined Genetic/Carrier Screen  NIPS:    AFP:    [place]:  TDaP Vaccine   12/10/21 Hgb A1C or  GTT Early 5.8 Third trimester   COVID Vaccine  not vaccinated   LAB RESULTS   Rhogam   Blood Type A/Positive/-- (06/06 1627) O positive  Baby Feeding Plan  Breast Antibody Negative (06/06 1627)negative  Contraception  DEPO Rubella 5.71 (06/06 1627)immune  Circumcision  NA RPR Non Reactive (10/09 1021) negative  Pediatrician   [facility] HBsAg Negative (06/06 1627) negative  Support Person  [place] HCVAb  negative  Prenatal Classes  HIV Non Reactive (10/09 1021)   negative  BTL Consent  GBS   (For PCN allergy, check sensitivities)   VBAC Consent  Pap  6/23 normal       DME Rx [Valu.Nieves] BP cuff [ X] Weight Scale Waterbirth  _3  Class _4  Consent _5  CNM visit  PHQ9 & GAD7 [ X ] new OB [x  ] 28 weeks  [  ] 36 weeks Induction  _6  Orders Entered _7 Foley Y/N    Past Medical History: Past Medical History:  Diagnosis Date    Anemia    Pregnant    S/P primary low transverse C-section 01/27/2015   UTI (urinary tract infection)     Past Surgical History: Past Surgical History:  Procedure Laterality Date   CESAREAN SECTION N/A 01/27/2015   Procedure: CESAREAN SECTION;  Surgeon: JTruett Mainland DO;  [facility];  Service: Obstetrics;  Laterality: N/A;   CHOLECYSTECTOMY N/A 11/11/2017   Procedure: LAPAROSCOPIC CHOLECYSTECTOMY WITH INTRAOPERATIVE CHOLANGIOGRAM;  Surgeon: TLeighton Ruff MD;  Location: [facility];  Service: General;  Laterality: N/A;    Obstetrical History: OB History     Gravida  4   Para  1   Term  1   Preterm      AB  2   Living  1      SAB  2   IAB      Ectopic      Multiple  0   Live Births  1           Social History Social History   Socioeconomic History   Marital status: Single    Spouse name: Not on file   Number of children: Not on file   Years of education: Not on file   Highest education level: Not on file  Occupational History  Not on file  Tobacco Use   Smoking status: Some Days    Types: Cigarettes   Smokeless tobacco: Never  Vaping Use   Vaping Use: Former  Substance and Sexual Activity   Alcohol use: No   Drug use: Not Currently    Types: Marijuana    Comment: stopped after confirmed pregnancy   Sexual activity: Yes    Birth control/protection: None    Comment: pregnant 06/11/2021  Other Topics Concern   Not on file  Social History Narrative   ** Merged History Encounter **       Social Determinants of Health   Financial Resource Strain: Not on file  Food Insecurity: No Food Insecurity (11/15/2020)   Hunger Vital Sign    Worried About Running Out of Food in the Last Year: Never true    Ran Out of Food in the Last Year: Never true  Transportation Needs: No Transportation Needs (11/15/2020)   PRAPARE - Hydrologist (Medical): No    Lack of Transportation (Non-Medical): No  Physical Activity: Not  on file  Stress: Not on file  Social Connections: Not on file    Family History: Family History  Problem Relation Age of Onset   Hypertension Mother    Diabetes Sister    Cancer Sister    Cancer Maternal Grandmother    Asthma Neg Hx    Heart disease Neg Hx    Stroke Neg Hx     Allergies: No Known Allergies  Medications Prior to Admission  Medication Sig Dispense Refill Last Dose   Blood Pressure Monitoring (BLOOD PRESSURE KIT) DEVI 1 kit by Does not apply route once a week. (Patient not taking: Reported on 01/29/2022) 1 each 0    loperamide (IMODIUM) 2 MG capsule Take 1 capsule (2 mg total) by mouth 4 (four) times daily as needed for diarrhea or loose stools. (Patient not taking: Reported on 11/28/2021) 12 capsule 0    Misc. Devices (GOJJI WEIGHT SCALE) MISC 1 Device by Does not apply route every 30 (thirty) days. 1 each 0    ondansetron (ZOFRAN-ODT) 4 MG disintegrating tablet Take 1 tablet (4 mg total) by mouth every 8 (eight) hours as needed for nausea or vomiting. (Patient not taking: Reported on 11/28/2021) 20 tablet 0    Prenat-Fe Poly-Methfol-FA-DHA (VITAFOL ULTRA) 29-0.6-0.4-200 MG CAPS Take 1 capsule by mouth daily. 30 capsule 11    promethazine (PHENERGAN) 25 MG tablet Take 1 tablet (25 mg total) by mouth every 6 (six) hours as needed for nausea or vomiting. (Patient not taking: Reported on 11/28/2021) 30 tablet 1      Review of Systems   All systems reviewed and negative except as stated in HPI  Height _0  (1.626 m), weight 60.3 kg, last menstrual period 05/17/2021, unknown if currently breastfeeding. General appearance: alert, cooperative, and appears stated age Lungs: clear to auscultation bilaterally Heart: regular rate and rhythm Abdomen: soft, non-tender; bowel sounds normal Pelvic: see below Extremities: Homans sign is negative, no sign of DVT  Presentation: cephalic - by ultrasound earlier today  Fetal monitoring: 125 bpm/ moderate variability / +accels,  no decels. Uterine activity: uterine irritability, no contractions.     Prenatal labs: ABO, Rh: A/Positive/-- (06/06 1627) Antibody: Negative (06/06 1627) Rubella: 5.71 (06/06 1627) RPR: Non Reactive (10/09 1021)  HBsAg: Negative (06/06 1627)  HIV: Non Reactive (10/09 1021)  GBS:   pending.  2 hr Glucola normal Genetic screening  declined Anatomy US: severe  FGR with normal dopplers.  Prenatal Transfer Tool  Maternal Diabetes: No Genetic Screening: Declined Maternal Ultrasounds/Referrals: IUGR Fetal Ultrasounds or other Referrals:  None Maternal Substance Abuse:  No Significant Maternal Medications:  None Significant Maternal Lab Results:  None Number of Prenatal Visits:greater than 3 verified prenatal visits Other Comments:  None  No results found for this or any previous visit (from the past 24 hour(s)).  Patient Active Problem List   Diagnosis Date Noted   Indication for care in labor and delivery, antepartum 02/13/2022   Fetal growth restriction antepartum 01/02/2022   History of cesarean section 08/07/2021   Supervision of other normal pregnancy, antepartum 07/23/2021    Assessment/Plan:  Kamelia Lampkins is a 25 y.o. G4P1021 at 59w4dhere for IOL for severe FGR ~3%ile, at term. Has a history of one previous C-section due to breech presentation in 2016  #Labor:admit to L&D. Plan for foley balloon and low dose pitocin to start IOL. #Pain: IV fentanyl, epidural, per patients preference #FWB: Cat 1 #ID:  GBS culture pending. GBS PCR collected to determine need for intrapartum antibiotic #MOF: breast #MOC: depo #Circ:  N/a  CLiliane ChannelMD MPH OB Fellow, [facility]12/13/2023

## 2022-02-13 NOTE — Progress Notes (Addendum)
Monica Nash is a 26 y.o. O1H0865 at [redacted]w[redacted]d by ultrasound admitted for induction of labor due to Poor fetal growth (3%) and TOLAC.  Subjective: Patient is feeling nervous and intermittently feeling lower abdominal pressure. None in bottom.   Objective: BP (!) 89/66   Pulse (!) 191   Temp 98.2 F (36.8 C) (Oral)   Resp 16   Ht 5\' 4"  (1.626 m)   Wt 60.3 kg   LMP 05/17/2021 (Approximate)   BMI 22.83 kg/m  No intake/output data recorded. No intake/output data recorded.  FHT:  FHR: 120 bpm, variability: moderate,  accelerations:  Present,  decelerations:  Absent UC:   irregular, every 3-4 minutes SVE:   Dilation: 1 Effacement (%): 70 Station: Ballotable Exam by:: [email or web address] CNM  Labs: Lab Results  Component Value Date   WBC 7.9 02/13/2022   HGB 8.9 (L) 02/13/2022   HCT 25.4 (L) 02/13/2022   MCV 88.2 02/13/2022   PLT 226 02/13/2022    Assessment / Plan: Induction of labor due to IUGR,  progressing well on pitocin  Labor: Progressing on Pitocin, placed Cook's Balloon via speculum - 54mL in uterine balloon.   Fetal Wellbeing:  Category I Pain Control:   IV pain meds and epidural on request I/D:   GBS negative Anticipated MOD:  NSVD  Jiyun N. 74m, MD 02/13/2022, 10:31 AM   RESIDENT COSIGN:  I confirm that I have verified and agree with the information documented in the resident's note and I have made any necessary editorial changes.   Introductions exchanged with patient. CNM to patient bedside to discuss insertion of FB to help labor progression. Discussed procedure both Risks and benefits with patient and agrees to proceed with plan of care. With patient permission, SVE performed and revealed :  Dilation: 1 Effacement (%): 70 Station: Ballotable Presentation: Vertex Exam by:: [email or web address] CNM  Patient given IV fentanyl for pain prior to procedure.  With sterile speculum cervix visualized. With Ring clamps and Cooks cath with Stylet, Cook balloon placed with  minimal difficulty. Balloon blown up to 60cc of fluid. CNM checked and confirmed placement via digital exam. Patient tolerated procedure well.     PLAN:   - Labor: FB in place on Pit. Continue to titrate Pit 1x1 as needed up to 8 mil/u while balloon in place. After expulsion assess for AROM.  - FWB: Cat I continue to monitor  - GBS Negative - I/D: GBS Negative  - MOD: TOLAC with VBAC anticipated at this time.    Dorathy Daft, CNM 02/13/2022 11:00 AM

## 2022-02-13 NOTE — Anesthesia Procedure Notes (Signed)
Epidural Patient location during procedure: OB Start time: 02/13/2022 5:44 PM End time: 02/13/2022 5:49 PM  Staffing Anesthesiologist: Bethena Midget, MD  Preanesthetic Checklist Completed: patient identified, IV checked, site marked, risks and benefits discussed, surgical consent, monitors and equipment checked, pre-op evaluation and timeout performed  Epidural Patient position: sitting Prep: DuraPrep and site prepped and draped Patient monitoring: continuous pulse ox and blood pressure Approach: midline Location: L3-L4 Injection technique: LOR air  Needle:  Needle type: Tuohy  Needle gauge: 17 G Needle length: 9 cm and 9 Needle insertion depth: 5 cm Catheter type: closed end flexible Catheter size: 19 Gauge Catheter at skin depth: 10 cm Test dose: negative  Assessment Events: blood not aspirated, no cerebrospinal fluid, injection not painful, no injection resistance, no paresthesia and negative IV test

## 2022-02-13 NOTE — Progress Notes (Signed)
Monica Nash is a 26 y.o. L2G4010 at [redacted]w[redacted]d by ultrasound admitted for induction of labor due to Poor fetal growth 3rd %tile with TOLAC.   Subjective: Patient doing well would like to walk for a while.   Objective: BP 113/64   Pulse 62   Temp 98.2 F (36.8 C) (Oral)   Resp 16   Ht 5\' 4"  (1.626 m)   Wt 60.3 kg   LMP 05/17/2021 (Approximate)   BMI 22.83 kg/m  No intake/output data recorded. No intake/output data recorded.  FHT:  FHR: 135 bpm, variability: moderate,  accelerations:  Present,  decelerations:  Absent UC:   regular, every 3-5 minutes SVE:   Dilation: 1 Effacement (%): 70 Station: Ballotable Exam by:: [email or web address] CNM  Labs: Lab Results  Component Value Date   WBC 7.9 02/13/2022   HGB 8.9 (L) 02/13/2022   HCT 25.4 (L) 02/13/2022   MCV 88.2 02/13/2022   PLT 226 02/13/2022    Assessment / Plan: Induction of labor due to IUGR,  progressing well on pitocin s/p FB placement.   Labor: Progressing on Pitocin, will continue to increase up to 8 mil/u until FB is expelled. Then assess for AROM Fetal Wellbeing:  Category I Pain Control:  Labor support without medications and IV pain meds.  I/D:   GBS Negative  Anticipated MOD:   TOLAC for successful VBAC at this time.   02/15/2022, CNM 02/13/2022, 2:31 PM

## 2022-02-13 NOTE — Progress Notes (Signed)
Patient ID: Monica Nash, female   DOB: 12/04/1995, 26 y.o.   MRN: [id number]  Sitting up in throne position; feeling some intermittent pressure  VSS, afebrile FHR 120s, +accels, no decels Ctx q 2-3 mins w Pit @ 11mu/min Cx was 6+/60/vtx -3 @ 1815  IUP@37 .4wks TOLAC Early active labor FGR  -Plan to check cx with continuous pressure -Anticipate successful VBAC -Desires to receive Depo prior to d/c from hospital  Arabella Merles CNM 02/13/2022

## 2022-02-13 NOTE — Anesthesia Preprocedure Evaluation (Signed)
Anesthesia Evaluation  Patient identified by MRN, date of birth, ID band Patient awake    Reviewed: Allergy & Precautions, H&P , NPO status , Patient's Chart, lab work & pertinent test results, reviewed documented beta blocker date and time   Airway Mallampati: I  TM Distance: >3 FB Neck ROM: full    Dental no notable dental hx.    Pulmonary neg pulmonary ROS, Current Smoker   Pulmonary exam normal breath sounds clear to auscultation       Cardiovascular negative cardio ROS Normal cardiovascular exam Rhythm:regular Rate:Normal     Neuro/Psych negative neurological ROS  negative psych ROS   GI/Hepatic negative GI ROS, Neg liver ROS,,,  Endo/Other  negative endocrine ROS    Renal/GU negative Renal ROS  negative genitourinary   Musculoskeletal   Abdominal   Peds  Hematology negative hematology ROS (+) Blood dyscrasia, anemia   Anesthesia Other Findings   Reproductive/Obstetrics (+) Pregnancy                             Anesthesia Physical Anesthesia Plan  ASA: 2  Anesthesia Plan: Epidural   Post-op Pain Management:    Induction:   PONV Risk Score and Plan:   Airway Management Planned:   Additional Equipment:   Intra-op Plan:   Post-operative Plan:   Informed Consent: I have reviewed the patients History and Physical, chart, labs and discussed the procedure including the risks, benefits and alternatives for the proposed anesthesia with the patient or authorized representative who has indicated his/her understanding and acceptance.     Dental Advisory Given  Plan Discussed with:   Anesthesia Plan Comments: (  )       Anesthesia Quick Evaluation

## 2022-02-13 NOTE — Progress Notes (Signed)
Labor Progress Note Monica Nash is a 26 y.o. Y5K3546 at [redacted]w[redacted]d presented for IOL TOLAC due to severe FGR S: she is doing well. Contractions are not too painful  O:  BP 124/71 (BP Location: Left Arm)   Pulse 77   Temp 98.2 F (36.8 C) (Oral)   Resp 16   Ht 5\' 4"  (1.626 m)   Wt 60.3 kg   LMP 05/17/2021 (Approximate)   BMI 22.83 kg/m  EFM: 125bpm /moderate /+accels, no decels  Contractions every 4-5 mins on toco  CVE: Dilation: Closed Effacement (%): Thick Station: Ballotable Exam by:: Mercedes Valeriano,md   A&P: 26 y.o. 25 [redacted]w[redacted]d IOL TOLAC for severe FGR #Labor: Induction contractions. On pitocin 3 units. Cervix still closed. Did not tolerate check well. Plan to continue titration of low dose pitocin, with max of 8 and recheck in 2hrs to eval for possible FB  #Pain:  IV fentanyl, epidural on requiest #FWB: Cat 1 #GBS  collected and pending  Monica Nash [redacted]w[redacted]d, MD 4:16 AM

## 2022-02-13 NOTE — Discharge Summary (Signed)
   Postpartum Discharge Summary  Date of Service updated 02/15/22     Patient Name: Monica Nash DOB: 07/21/1995 MRN: [id number]  Date of admission: 02/13/2022 Delivery date:02/13/2022  Delivering provider: SHAW, [name] D  Date of discharge: 02/15/2022  Admitting diagnosis: Fetal growth restriction antepartum [O36.5990] Intrauterine pregnancy: [redacted]w[redacted]d     Secondary diagnosis:  Principal Problem:   Fetal growth restriction antepartum Active Problems:   History of cesarean section   Indication for care in labor and delivery, antepartum  Additional problems: none    Discharge diagnosis: Term Pregnancy Delivered and VBAC                                              Post partum procedures: Depo prior to d/c   Augmentation: AROM, Pitocin, and IP Foley Complications: None  Hospital course: Induction of Labor With Vacuum assisted Vaginal Delivery   26 y.o. yo G4P1021 at [redacted]w[redacted]d was admitted to the hospital 02/13/2022 for induction of labor.  Indication for induction:  severe FGR 3rd% w nl dopplers; also hx of prev C/S for breech .  Patient had an uncomplicated labor course. Membrane Rupture Time/Date: 4:23 PM ,02/13/2022   Delivery Method:Vaginal, Spontaneous  Episiotomy: None  Lacerations:  None  Details of delivery can be found in separate delivery note.  Patient had a postpartum course complicated by N/A . Patient is discharged home 02/15/22.  Newborn Data: Birth date:02/13/2022  Birth time:10:21 PM  Gender:Female  Living status:Living  Apgars:9 ,9  Weight:2350 g (5lb 2.9oz)  Magnesium Sulfate received: No BMZ received: No Rhophylac:N/A MMR:N/A T-DaP:Given prenatally Flu: No Transfusion:No  Physical exam  Vitals:   02/14/22 1000 02/14/22 1500 02/14/22 2118 02/15/22 0516  BP: 125/80 129/73 108/65 117/61  Pulse: (!) 56 71 71 61  Resp: 20 20 18 17  Temp: 98.5 F (36.9 C) 98.8 F (37.1 C) 98.3 F (36.8 C) 98 F (36.7 C)  TempSrc: Oral Oral Oral Oral  SpO2: 100%  100% 100% 100%  Weight:      Height:       General: alert, cooperative, and no distress Lochia: appropriate Uterine Fundus: firm Incision: N/A DVT Evaluation: No evidence of DVT seen on physical exam. No significant calf/ankle edema. Labs: Lab Results  Component Value Date   WBC 13.6 (H) 02/14/2022   HGB 8.3 (L) 02/14/2022   HCT 25.0 (L) 02/14/2022   MCV 89.9 02/14/2022   PLT 189 02/14/2022      Latest Ref Rng & Units 11/23/2021   10:51 PM  CMP  Glucose 70 - 99 mg/dL 78   BUN 6 - 20 mg/dL 8   Creatinine 0.44 - 1.00 mg/dL 0.59   Sodium 135 - 145 mmol/L 137   Potassium 3.5 - 5.1 mmol/L 3.3   Chloride 98 - 111 mmol/L 106   CO2 22 - 32 mmol/L 22   Calcium 8.9 - 10.3 mg/dL 9.3   Total Protein 6.5 - 8.1 g/dL 6.8   Total Bilirubin 0.3 - 1.2 mg/dL 0.8   Alkaline Phos 38 - 126 U/L 51   AST 15 - 41 U/L 19   ALT 0 - 44 U/L 13    Edinburgh Score:    02/14/2022    9:18 PM  Edinburgh Postnatal Depression Scale Screening Tool  I have been able to laugh and see the funny side of things. 0  I   have looked forward with enjoyment to things. 1  I have blamed myself unnecessarily when things went wrong. 0  I have been anxious or worried for no good reason. 0  I have felt scared or panicky for no good reason. 0  Things have been getting on top of me. 0  I have been so unhappy that I have had difficulty sleeping. 0  I have felt sad or miserable. 0  I have been so unhappy that I have been crying. 0  The thought of harming myself has occurred to me. 0  Edinburgh Postnatal Depression Scale Total 1     After visit meds:  Allergies as of 02/15/2022   No Known Allergies      Medication List     TAKE these medications    Blood Pressure Kit Devi 1 kit by Does not apply route once a week.   Ferrous Fumarate 324 (106 Fe) MG Tabs tablet Commonly known as: HEMOCYTE - 106 mg FE Take 1 tablet (106 mg of iron total) by mouth every other day. Start taking on: February 16, 2022    Gojji Weight Scale Misc 1 Device by Does not apply route every 30 (thirty) days.   ibuprofen 600 MG tablet Commonly known as: ADVIL Take 1 tablet (600 mg total) by mouth every 6 (six) hours.   loperamide 2 MG capsule Commonly known as: IMODIUM Take 1 capsule (2 mg total) by mouth 4 (four) times daily as needed for diarrhea or loose stools.   ondansetron 4 MG disintegrating tablet Commonly known as: ZOFRAN-ODT Take 1 tablet (4 mg total) by mouth every 8 (eight) hours as needed for nausea or vomiting.   promethazine 25 MG tablet Commonly known as: PHENERGAN Take 1 tablet (25 mg total) by mouth every 6 (six) hours as needed for nausea or vomiting.   Vitafol Ultra 29-0.6-0.4-200 MG Caps Take 1 capsule by mouth daily.         Discharge home in stable condition Infant Feeding: Bottle Infant Disposition:home with mother Discharge instruction: per After Visit Summary and Postpartum booklet. Activity: Advance as tolerated. Pelvic rest for 6 weeks.  Diet: routine diet Future Appointments: Future Appointments  Date Time Provider Department Center  04/03/2022  1:10 PM Cresenzo, John V, MD [facility] None   Follow up Visit:  Shaw, [name] D, CNM  P [facility] Please schedule this patient for Postpartum visit in: 6 weeks with the following provider: Any provider In-Person For C/S patients schedule nurse incision check in weeks 2 weeks: no High risk pregnancy complicated by: severe FGR; prev C/s Delivery mode:  VBAC Anticipated Birth Control:   PP Depo given PP Procedures needed: none Schedule Integrated BH visit: no   02/15/2022 Shay M Payne, CNM    

## 2022-02-14 LAB — CBC
HCT: 25 % — ABNORMAL LOW (ref 36.0–46.0)
Hemoglobin: 8.3 g/dL — ABNORMAL LOW (ref 12.0–15.0)
MCH: 29.9 pg (ref 26.0–34.0)
MCHC: 33.2 g/dL (ref 30.0–36.0)
MCV: 89.9 fL (ref 80.0–100.0)
Platelets: 189 10*3/uL (ref 150–400)
RBC: 2.78 MIL/uL — ABNORMAL LOW (ref 3.87–5.11)
RDW: 14.5 % (ref 11.5–15.5)
WBC: 13.6 10*3/uL — ABNORMAL HIGH (ref 4.0–10.5)
nRBC: 0 % (ref 0.0–0.2)

## 2022-02-14 MED ORDER — COCONUT OIL OIL: 1.0000 | TOPICAL_OIL | Status: DC | PRN

## 2022-02-14 MED ORDER — ACETAMINOPHEN 325 MG PO TABS: 650.0000 mg | ORAL_TABLET | ORAL | Status: DC | PRN

## 2022-02-14 MED ORDER — SIMETHICONE 80 MG PO CHEW: 80.0000 mg | CHEWABLE_TABLET | ORAL | Status: DC | PRN

## 2022-02-14 MED ORDER — DIPHENHYDRAMINE HCL 25 MG PO CAPS: 25.0000 mg | ORAL_CAPSULE | Freq: Four times a day (QID) | ORAL | Status: DC | PRN

## 2022-02-14 MED ORDER — IBUPROFEN 600 MG PO TABS
600.0000 mg | ORAL_TABLET | Freq: Four times a day (QID) | ORAL | Status: DC
  Administered 2022-02-14 – 2022-02-15 (×7): 600 mg via ORAL
  Filled 2022-02-14 (×7): qty 1

## 2022-02-14 MED ORDER — ZOLPIDEM TARTRATE 5 MG PO TABS: 5.0000 mg | ORAL_TABLET | Freq: Every evening | ORAL | Status: DC | PRN

## 2022-02-14 MED ORDER — BENZOCAINE-MENTHOL 20-0.5 % EX AERO
1.0000 | INHALATION_SPRAY | CUTANEOUS | Status: DC | PRN
  Administered 2022-02-14: 1 via TOPICAL
  Filled 2022-02-14: qty 56

## 2022-02-14 MED ORDER — PRENATAL MULTIVITAMIN CH
1.0000 | ORAL_TABLET | Freq: Every day | ORAL | Status: DC
  Administered 2022-02-14 – 2022-02-15 (×2): 1 via ORAL
  Filled 2022-02-14 (×2): qty 1

## 2022-02-14 MED ORDER — ONDANSETRON HCL 4 MG/2ML IJ SOLN: 4.0000 mg | INTRAMUSCULAR | Status: DC | PRN

## 2022-02-14 MED ORDER — MEDROXYPROGESTERONE ACETATE 150 MG/ML IM SUSP
150.0000 mg | INTRAMUSCULAR | Status: AC | PRN
  Administered 2022-02-15: 150 mg via INTRAMUSCULAR
  Filled 2022-02-14: qty 1

## 2022-02-14 MED ORDER — MEASLES, MUMPS & RUBELLA VAC IJ SOLR: 0.5000 mL | Freq: Once | INTRAMUSCULAR | Status: DC

## 2022-02-14 MED ORDER — WITCH HAZEL-GLYCERIN EX PADS: 1.0000 | MEDICATED_PAD | CUTANEOUS | Status: DC | PRN

## 2022-02-14 MED ORDER — FERROUS FUMARATE 324 (106 FE) MG PO TABS
1.0000 | ORAL_TABLET | ORAL | Status: DC
  Administered 2022-02-14: 106 mg via ORAL
  Filled 2022-02-14: qty 1

## 2022-02-14 MED ORDER — ONDANSETRON HCL 4 MG PO TABS: 4.0000 mg | ORAL_TABLET | ORAL | Status: DC | PRN

## 2022-02-14 MED ORDER — OXYCODONE HCL 5 MG PO TABS: 5.0000 mg | ORAL_TABLET | ORAL | Status: DC | PRN

## 2022-02-14 MED ORDER — SENNOSIDES-DOCUSATE SODIUM 8.6-50 MG PO TABS
2.0000 | ORAL_TABLET | ORAL | Status: DC
  Filled 2022-02-14 (×2): qty 2

## 2022-02-14 MED ORDER — TETANUS-DIPHTH-ACELL PERTUSSIS 5-2.5-18.5 LF-MCG/0.5 IM SUSY: 0.5000 mL | PREFILLED_SYRINGE | Freq: Once | INTRAMUSCULAR | Status: DC

## 2022-02-14 MED ORDER — DIBUCAINE (PERIANAL) 1 % EX OINT: 1.0000 | TOPICAL_OINTMENT | CUTANEOUS | Status: DC | PRN

## 2022-02-14 NOTE — Anesthesia Postprocedure Evaluation (Signed)
Anesthesia Post Note  Patient: Building control surveyor  Procedure(s) Performed: AN AD HOC LABOR EPIDURAL     Patient location during evaluation: Mother Baby Anesthesia Type: Epidural Level of consciousness: awake, oriented and awake and alert Pain management: pain level controlled Vital Signs Assessment: post-procedure vital signs reviewed and stable Respiratory status: spontaneous breathing, respiratory function stable and nonlabored ventilation Cardiovascular status: stable Postop Assessment: no headache, adequate PO intake, able to ambulate, patient able to bend at knees and no apparent nausea or vomiting Anesthetic complications: no   No notable events documented.  Last Vitals:  Vitals:   02/14/22 0137 02/14/22 0509  BP: 118/70 (!) 121/54  Pulse: 61 (!) 51  Resp: 18 18  Temp: 36.9 C 37 C  SpO2: 100% 100%    Last Pain:  Vitals:   02/14/22 0509  TempSrc: Oral  PainSc: 0-No pain   Pain Goal:                   Sixto Bowdish

## 2022-02-14 NOTE — Progress Notes (Addendum)
This RN went in to reassess pt at 0509. Patient told RN that she fell while in the shower around 0400. She felt her legs starting to "give-out" and she lowered herself to the floor. She states a knee and her bottom hit the floor but it was not a hard fall. RN had encouraged her previously to wait until later in the morning to shower due to her legs feeling tingly at 0137. Pt insisted on taking a shower at that time. No acute injury was noted upon assessment. Dr. Landry Corporal notified of event. No new orders.

## 2022-02-14 NOTE — Progress Notes (Signed)
Post Partum Day #1 Subjective: no complaints, up ad lib, and tolerating PO; breast and bottlefeeding; desires depo prior to d/c  Objective: Blood pressure (!) 121/54, pulse (!) 51, temperature 98.6 F (37 C), temperature source Oral, resp. rate 18, height 5\' 4"  (1.626 m), weight 60.3 kg, last menstrual period 05/17/2021, SpO2 100 %, unknown if currently breastfeeding.  Physical Exam:  General: alert, cooperative, and no distress Lochia: appropriate Uterine Fundus: firm DVT Evaluation: No evidence of DVT seen on physical exam.  Recent Labs    02/13/22 0040 02/14/22 0100  HGB 8.9* 8.3*  HCT 25.4* 25.0*    Assessment/Plan: Plan for discharge tomorrow Depo ordered  Fe qod ordered   LOS: 1 day   02/16/22, CNM 02/14/2022, 9:05 AM

## 2022-02-14 NOTE — Progress Notes (Signed)
   02/14/22 0509  What Happened  Was fall witnessed? No  Provider Notification  Provider Name/Title Ndule  Date Provider Notified 02/14/22  Time Provider Notified [phone]  Method of Notification Call  Notification Reason Fall  Provider response No new orders  Adult Fall Risk Assessment  Patient Fall Risk Level High fall risk  Vitals  Temp 98.6 F (37 C)  Temp Source Oral  BP (!) 121/54  BP Location Right Arm  BP Method Automatic  Patient Position (if appropriate) Semi-fowlers  Pulse Rate (!) 51  Pulse Rate Source Dinamap  Resp 18  Oxygen Therapy  SpO2 100 %  Pain Assessment  Pain Score 0  PCA/Epidural/Spinal Assessment  Respiratory Pattern Regular;Unlabored  Neurological  Neuro (WDL) WDL  Musculoskeletal  Musculoskeletal (WDL) WDL  Integumentary  Integumentary (WDL) WDL

## 2022-02-15 ENCOUNTER — Encounter (HOSPITAL_COMMUNITY): Payer: Self-pay | Admitting: Obstetrics and Gynecology

## 2022-02-15 LAB — CULTURE, BETA STREP (GROUP B ONLY): Strep Gp B Culture: NEGATIVE

## 2022-02-15 MED ORDER — FERROUS FUMARATE 324 (106 FE) MG PO TABS: 1.0000 | ORAL_TABLET | ORAL | 2 refills | Status: DC

## 2022-02-15 MED ORDER — IBUPROFEN 600 MG PO TABS: 600.0000 mg | ORAL_TABLET | Freq: Four times a day (QID) | ORAL | 0 refills | Status: DC

## 2022-02-18 ENCOUNTER — Encounter: Payer: Medicaid Other | Admitting: Obstetrics and Gynecology

## 2022-02-18 ENCOUNTER — Telehealth: Payer: Self-pay | Admitting: *Deleted

## 2022-02-18 NOTE — Patient Outreach (Signed)
Transitional Care Management Follow-up Telephone Call  Kristia Jupiter was discharged from [facility] at [facility]), has or will have a pregnancy risk assessment at next visit, has a scheduled  Postpartum  appointment with the [facility] provider on 04/03/22 , and has been referred for appropriate case management and ongoing follow up.   Estanislado Emms RN, BSN [place]  [employer]

## 2022-02-19 ENCOUNTER — Ambulatory Visit: Payer: Medicaid Other

## 2022-02-19 ENCOUNTER — Other Ambulatory Visit: Payer: Medicaid Other

## 2022-02-21 ENCOUNTER — Telehealth (HOSPITAL_COMMUNITY): Payer: Self-pay | Admitting: *Deleted

## 2022-02-21 NOTE — Telephone Encounter (Signed)
Patient reported having passed a clot the "size of a golf ball 2 days ago." None since then. RN encouraged patient to contact OB to report clot. Patient voiced no other questions or concerns regarding her health at this time. EPDS=2. Patient voiced no questions or concerns regarding infant at this time. Patient reports infant sleeps in a bassinet on her back. RN reviewed ABCs of safe sleep. Patient verbalized understanding. Patient requested RN email information on hospital's virtual postpartum classes and support groups. Email sent. Deforest Hoyles, RN, 02/21/22, 2000

## 2022-02-28 ENCOUNTER — Encounter: Payer: Medicaid Other | Admitting: Obstetrics and Gynecology

## 2022-03-06 ENCOUNTER — Encounter: Payer: Self-pay | Admitting: Obstetrics and Gynecology

## 2022-03-11 ENCOUNTER — Encounter: Payer: Self-pay | Admitting: Obstetrics and Gynecology

## 2022-03-19 ENCOUNTER — Encounter: Payer: Self-pay | Admitting: Obstetrics and Gynecology

## 2022-03-26 ENCOUNTER — Encounter: Payer: Self-pay | Admitting: Obstetrics and Gynecology

## 2022-04-03 ENCOUNTER — Ambulatory Visit (INDEPENDENT_AMBULATORY_CARE_PROVIDER_SITE_OTHER): Payer: Medicaid Other | Admitting: Obstetrics

## 2022-04-03 ENCOUNTER — Encounter: Payer: Self-pay | Admitting: Obstetrics

## 2022-04-03 ENCOUNTER — Ambulatory Visit: Payer: Medicaid Other | Admitting: Family Medicine

## 2022-04-03 DIAGNOSIS — Z30013 Encounter for initial prescription of injectable contraceptive: Secondary | ICD-10-CM | POA: Diagnosis not present

## 2022-04-03 MED ORDER — MEDROXYPROGESTERONE ACETATE 150 MG/ML IM SUSP: 150.0000 mg | INTRAMUSCULAR | 4 refills | Status: DC

## 2022-04-03 NOTE — Progress Notes (Unsigned)
[facility] Partum Visit Note  Monica Nash is a 27 y.o. [phone] female who presents for a postpartum visit. She is 7 weeks postpartum following a normal spontaneous vaginal delivery.  I have fully reviewed the prenatal and intrapartum course. The delivery was at 5 gestational weeks.  Anesthesia: epidural. Postpartum course has been good. Baby is doing well. Baby is feeding by bottle - Similac Neosure. Bleeding staining only. Bowel function is normal. Bladder function is normal. Patient is sexually active. Contraception method is Depo-Provera injections. Postpartum depression screening: negative.   Upstream - 04/03/22 1528       Pregnancy Intention Screening   Does the patient want to become pregnant in the next year? No    Does the patient's partner want to become pregnant in the next year? No    Would the patient like to discuss contraceptive options today? No            The pregnancy intention screening data noted above was reviewed. Potential methods of contraception were discussed. The patient elected to proceed with No data recorded.   Edinburgh Postnatal Depression Scale - 04/03/22 1527       Edinburgh Postnatal Depression Scale:  In the Past 7 Days   I have been able to laugh and see the funny side of things. 0    I have looked forward with enjoyment to things. 0    I have blamed myself unnecessarily when things went wrong. 0    I have been anxious or worried for no good reason. 0    I have felt scared or panicky for no good reason. 0    Things have been getting on top of me. 0    I have been so unhappy that I have had difficulty sleeping. 0    I have felt sad or miserable. 0    I have been so unhappy that I have been crying. 0    The thought of harming myself has occurred to me. 0    Edinburgh Postnatal Depression Scale Total 0             Health Maintenance Due  Topic Date Due   COVID-19 Vaccine (1) Never done   HPV VACCINES (1 - 2-dose series) Never done    INFLUENZA VACCINE  Never done    {Common ambulatory SmartLinks:19316}  Review of Systems {ros; complete:30496}  Objective:  BP 129/73   Pulse 66   Ht 5\' 4"  (1.626 m)   Wt 109 lb 1.6 oz (49.5 kg)   LMP 05/17/2021 (Approximate)   BMI 18.73 kg/m    General:  {gen appearance:16600}   Breasts:  {desc; normal/abnormal/not indicated:14647}  Lungs: {lung exam:16931}  Heart:  {heart exam:5510}  Abdomen: {abdomen exam:16834}   Wound {Wound assessment:11097}  GU exam:  {desc; normal/abnormal/not indicated:14647}       Assessment:    There are no diagnoses linked to this encounter.  *** postpartum exam.   Plan:   Essential components of care per ACOG recommendations:  1.  Mood and well being: Patient with {gen negative/positive:315881} depression screening today. Reviewed local resources for support.  - Patient tobacco use? {tobacco use:25506}  - hx of drug use? {yes/no:25505}    2. Infant care and feeding:  -Patient currently breastmilk feeding? {yes/no:25502}  -Social determinants of health (SDOH) reviewed in EPIC. No concerns***The following needs were identified***  3. Sexuality, contraception and birth spacing - Patient {DOES_DOES EXH:37169} want a pregnancy in the next year.  Desired family  size is {NUMBER 1-10:22536} children.  - Reviewed reproductive life planning. Reviewed contraceptive methods based on pt preferences and effectiveness.  Patient desired {Upstream End Methods:24109} today.   - Discussed birth spacing of 18 months  4. Sleep and fatigue -Encouraged family/partner/community support of 4 hrs of uninterrupted sleep to help with mood and fatigue  5. Physical Recovery  - Discussed patients delivery and complications. She describes her labor as {description:25511} - Patient had a {CHL AMB DELIVERY:[id number]}. Patient had a {laceration:25518} laceration. Perineal healing reviewed. Patient expressed understanding - Patient has urinary incontinence?  {yes/no:25515} - Patient {ACTION; IS/IS XBD:53299242} safe to resume physical and sexual activity  6.  Health Maintenance - HM due items addressed {Yes or If no, why not?:20788} - Last pap smear  Diagnosis  Date Value Ref Range Status  08/07/2021   Final   - Negative for intraepithelial lesion or malignancy (NILM)   Pap smear {done:10129} at today's visit.  -Breast Cancer screening indicated? {indicated:25516}  7. Chronic Disease/Pregnancy Condition follow up: {Follow up:25499}  - PCP follow up  Baltazar Najjar, [place] for [facility], [place], [facility] 04/03/2022

## 2022-05-08 ENCOUNTER — Ambulatory Visit (INDEPENDENT_AMBULATORY_CARE_PROVIDER_SITE_OTHER): Payer: Medicaid Other

## 2022-05-08 DIAGNOSIS — Z3042 Encounter for surveillance of injectable contraceptive: Secondary | ICD-10-CM

## 2022-05-08 MED ORDER — MEDROXYPROGESTERONE ACETATE 150 MG/ML IM SUSP
150.0000 mg | INTRAMUSCULAR | Status: DC
  Administered 2022-05-08: 150 mg via INTRAMUSCULAR

## 2022-05-08 NOTE — Progress Notes (Signed)
Pt is in the office for depo injection, administered in R Del and pt tolerated well. Next due May 22-June 5 .. Administrations This Visit     medroxyPROGESTERone (DEPO-PROVERA) injection 150 mg     Admin Date 05/08/2022 Action Given Dose 150 mg Route Intramuscular Administered By Hinton Lovely, RN

## 2022-06-06 ENCOUNTER — Encounter: Payer: Medicaid Other | Admitting: Family Medicine

## 2022-06-06 ENCOUNTER — Telehealth: Payer: Medicaid Other | Admitting: Family Medicine

## 2022-06-06 DIAGNOSIS — F526 Dyspareunia not due to a substance or known physiological condition: Secondary | ICD-10-CM

## 2022-06-06 DIAGNOSIS — N898 Other specified noninflammatory disorders of vagina: Secondary | ICD-10-CM

## 2022-06-06 NOTE — Progress Notes (Signed)
.  Because vaginal discharge with painful intercourse- you should be seen so a swab can be collect prior to treatment, I feel your condition warrants further evaluation and I recommend that you be seen in a face to face visit.   NOTE: There will be NO CHARGE for this eVisit

## 2022-06-06 NOTE — Progress Notes (Signed)
Duplicate

## 2022-06-20 ENCOUNTER — Ambulatory Visit
Admission: EM | Admit: 2022-06-20 | Discharge: 2022-06-20 | Disposition: A | Discharge status: Home / Self Care | Payer: Medicaid Other | Attending: Family Medicine | Admitting: Family Medicine

## 2022-06-20 ENCOUNTER — Telehealth: Payer: Medicaid Other | Admitting: Family Medicine

## 2022-06-20 DIAGNOSIS — R509 Fever, unspecified: Secondary | ICD-10-CM

## 2022-06-20 DIAGNOSIS — M549 Dorsalgia, unspecified: Secondary | ICD-10-CM

## 2022-06-20 DIAGNOSIS — N390 Urinary tract infection, site not specified: Secondary | ICD-10-CM

## 2022-06-20 LAB — POCT URINALYSIS DIP (MANUAL ENTRY)
Bilirubin, UA: NEGATIVE
Glucose, UA: NEGATIVE mg/dL
Ketones, POC UA: NEGATIVE mg/dL
Nitrite, UA: NEGATIVE
Protein Ur, POC: 30 mg/dL — AB
Spec Grav, UA: 1.01
Urobilinogen, UA: 1 U/dL
pH, UA: 7

## 2022-06-20 LAB — POCT URINE PREGNANCY: Preg Test, Ur: NEGATIVE

## 2022-06-20 MED ORDER — ACETAMINOPHEN 325 MG PO TABS
650.0000 mg | ORAL_TABLET | Freq: Once | ORAL | Status: AC
  Administered 2022-06-20: 650 mg via ORAL

## 2022-06-20 MED ORDER — CIPROFLOXACIN HCL 500 MG PO TABS: 500.0000 mg | ORAL_TABLET | Freq: Two times a day (BID) | ORAL | 0 refills | Status: AC

## 2022-06-20 NOTE — ED Provider Notes (Signed)
[name]    [name]: [id number] Arrival date & time: 06/20/22  1642      History   Chief Complaint Chief Complaint  Patient presents with   Fever   Back Pain    HPI Monica Nash is a 27 y.o. female.    Fever Back Pain Associated symptoms: fever    Here for fever and pain in her right side and back and in her neck.  No dysuria.  Her period started today.  She is on Depo-Provera  No cough or congestion and no nausea or vomiting or diarrhea.  Past Medical History:  Diagnosis Date   Anemia    Pregnant    S/P primary low transverse C-section 01/27/2015   UTI (urinary tract infection)     Patient Active Problem List   Diagnosis Date Noted   Indication for care in labor and delivery, antepartum 02/13/2022   Fetal growth restriction antepartum 01/02/2022   History of cesarean section 08/07/2021   Supervision of other normal pregnancy, antepartum 07/23/2021    Past Surgical History:  Procedure Laterality Date   CESAREAN SECTION N/A 01/27/2015   Procedure: CESAREAN SECTION;  Surgeon: Levie Heritage, DO;  Location: [facility];  Service: Obstetrics;  Laterality: N/A;   CHOLECYSTECTOMY N/A 11/11/2017   Procedure: LAPAROSCOPIC CHOLECYSTECTOMY WITH INTRAOPERATIVE CHOLANGIOGRAM;  Surgeon: Romie Levee, MD;  Location: [facility];  Service: General;  Laterality: N/A;    OB History     Gravida  4   Para  2   Term  2   Preterm      AB  2   Living  2      SAB  2   IAB      Ectopic      Multiple  0   Live Births  2            Home Medications    Prior to Admission medications   Medication Sig Start Date End Date Taking? Authorizing Provider  ciprofloxacin (CIPRO) 500 MG tablet Take 1 tablet (500 mg total) by mouth 2 (two) times daily for 7 days. 06/20/22 06/27/22 Yes Zenia Resides, MD  medroxyPROGESTERone (DEPO-PROVERA) 150 MG/ML injection Inject 1 mL (150 mg total) into the muscle every 3 (three) months. 04/03/22   Brock Bad, MD     Family History Family History  Problem Relation Age of Onset   Hypertension Mother    Diabetes Sister    Cancer Sister    Cancer Maternal Grandmother    Asthma Neg Hx    Heart disease Neg Hx    Stroke Neg Hx     Social History Social History   Tobacco Use   Smoking status: Former    Types: Cigarettes   Smokeless tobacco: Never  Vaping Use   Vaping Use: Every day  Substance Use Topics   Alcohol use: No    Comment: socially   Drug use: Yes    Types: Marijuana    Comment: stopped after confirmed pregnancy     Allergies   Patient has no known allergies.   Review of Systems Review of Systems  Constitutional:  Positive for fever.  Musculoskeletal:  Positive for back pain.     Physical Exam Triage Vital Signs ED Triage Vitals  Enc Vitals Group     BP 06/20/22 1822 133/87     Pulse Rate 06/20/22 1822 (!) 102     Resp 06/20/22 1822 16     Temp 06/20/22 1822 Marland Kitchen)  101.9 F (38.8 C)     Temp Source 06/20/22 1822 Oral     SpO2 --      Weight --      Height --      Head Circumference --      Peak Flow --      Pain Score 06/20/22 1823 4     Pain Loc --      Pain Edu? --      Excl. in GC? --    No data found.  Updated Vital Signs BP 133/87 (BP Location: Left Arm)   Pulse (!) 102   Temp (!) 101.9 F (38.8 C) (Oral)   Resp 16   LMP 06/20/2022 (Exact Date)   Visual Acuity Right Eye Distance:   Left Eye Distance:   Bilateral Distance:    Right Eye Near:   Left Eye Near:    Bilateral Near:     Physical Exam Vitals reviewed.  Constitutional:      General: She is not in acute distress.    Appearance: She is not ill-appearing, toxic-appearing or diaphoretic.  HENT:     Mouth/Throat:     Mouth: Mucous membranes are moist.     Pharynx: No oropharyngeal exudate or posterior oropharyngeal erythema.  Eyes:     Extraocular Movements: Extraocular movements intact.     Conjunctiva/sclera: Conjunctivae normal.     Pupils: Pupils are equal, round, and  reactive to light.  Cardiovascular:     Rate and Rhythm: Normal rate and regular rhythm.     Heart sounds: No murmur heard. Pulmonary:     Effort: Pulmonary effort is normal.     Breath sounds: Normal breath sounds.  Abdominal:     General: There is no distension.     Palpations: Abdomen is soft. There is no mass.     Tenderness: There is no right CVA tenderness, left CVA tenderness or guarding.     Comments: There is a little epigastric tenderness, but none in the lower abdomen  Musculoskeletal:     Cervical back: Neck supple.  Lymphadenopathy:     Cervical: No cervical adenopathy.  Skin:    Coloration: Skin is not pale.  Neurological:     General: No focal deficit present.     Mental Status: She is alert and oriented to person, place, and time.      UC Treatments / Results  Labs (all labs ordered are listed, but only abnormal results are displayed) Labs Reviewed  POCT URINALYSIS DIP (MANUAL ENTRY) - Abnormal; Notable for the following components:      Result Value   Blood, UA moderate (*)    Protein Ur, POC =30 (*)    Leukocytes, UA Trace (*)    All other components within normal limits  POCT URINE PREGNANCY    EKG   Radiology No results found.  Procedures Procedures (including critical care time)  Medications Ordered in UC Medications  acetaminophen (TYLENOL) tablet 650 mg (650 mg Oral Given 06/20/22 1828)    Initial Impression / Assessment and Plan / UC Course  I have reviewed the triage vital signs and the nursing notes.  Pertinent labs & imaging results that were available during my care of the patient were reviewed by me and considered in my medical decision making (see chart for details).        Urinalysis shows some blood and trace of leukocytes.  Pregnancy test is negative.  Urine culture is sent, and Cipro was sent for  UTI. Final Clinical Impressions(s) / UC Diagnoses   Final diagnoses:  Fever, unspecified  Urinary tract infection without  hematuria, site unspecified     Discharge Instructions      Pregnancy test is negative.  Urinalysis does show some blood that could be from the menstrual cycle, but there also are some white blood cells.  This could be a sign of a urinary tract infection.  Take Cipro 500 mg--1 tablet 2 times daily for 7 days  Urine culture is also sent, and staff will notify you if it looks like the antibiotic needs to be changed. There is still a possibility that this could be a viral illness causing her fever.  Continue taking Tylenol or ibuprofen as needed for the fever and aches      ED Prescriptions     Medication Sig Dispense Auth. Provider   ciprofloxacin (CIPRO) 500 MG tablet Take 1 tablet (500 mg total) by mouth 2 (two) times daily for 7 days. 14 tablet Kassia Demarinis, Janace Aris, MD      PDMP not reviewed this encounter.   Zenia Resides, MD 06/20/22 [phone]

## 2022-06-20 NOTE — Discharge Instructions (Signed)
Pregnancy test is negative.  Urinalysis does show some blood that could be from the menstrual cycle, but there also are some white blood cells.  This could be a sign of a urinary tract infection.  Take Cipro 500 mg--1 tablet 2 times daily for 7 days  Urine culture is also sent, and staff will notify you if it looks like the antibiotic needs to be changed. There is still a possibility that this could be a viral illness causing her fever.  Continue taking Tylenol or ibuprofen as needed for the fever and aches

## 2022-06-20 NOTE — ED Triage Notes (Signed)
Patient with c/o back pain, ride side pain and neck pain for couple of days. Patient states she thinks she may also have a UTI. Reports fever at home. Last dose of ibuprofen was this morning some time.

## 2022-06-20 NOTE — Progress Notes (Signed)
Because back pain with known cause with wt and appetite changes condition warrants further evaluation and I recommend that you be seen in a face to face visit.   NOTE: There will be NO CHARGE for this eVisit

## 2022-07-02 ENCOUNTER — Telehealth (INDEPENDENT_AMBULATORY_CARE_PROVIDER_SITE_OTHER): Payer: Medicaid Other | Admitting: Physician Assistant

## 2022-07-02 DIAGNOSIS — B379 Candidiasis, unspecified: Secondary | ICD-10-CM

## 2022-07-03 MED ORDER — FLUCONAZOLE 150 MG PO TABS: ORAL_TABLET | ORAL | 0 refills | Status: DC

## 2022-07-03 NOTE — Progress Notes (Signed)
I have spent 5 minutes in review of e-visit questionnaire, review and updating patient chart, medical decision making and response to patient.   Makena Murdock Cody Brodi Nery, PA-C    

## 2022-07-03 NOTE — Progress Notes (Signed)

## 2022-07-08 ENCOUNTER — Telehealth: Payer: Medicaid Other | Admitting: Physician Assistant

## 2022-07-08 DIAGNOSIS — N760 Acute vaginitis: Secondary | ICD-10-CM

## 2022-07-08 NOTE — Progress Notes (Signed)
Because of frequent vaginitis/recurring symptoms, I feel your condition warrants further evaluation and I recommend that you be seen in a face to face visit.   NOTE: There will be NO CHARGE for this eVisit   If you are having a true medical emergency please call 911.      For an urgent face to face visit, [facility] has eight urgent care centers for your convenience:   NEW!! [facility] Health Urgent Care Center at [facility] Get Driving Directions [phone] [address] [place], [address]    [facility] Health Urgent Care Center at [facility] Get Driving Directions [phone] [address] [place], [place] [address]   [facility] Health Urgent Care Center [facility]) Get Driving Directions [phone] [address] [place], [place] [address]  [facility] Health Urgent Care Center [facility]) Get Driving Directions [phone] [address] [place],  [place]  [address]  [facility] Urgent Care Center [facility] - at [employer]  [phone] [address] [place],  [place] [address]   [facility] Health Urgent Care at [facility] Get Driving Directions [phone] [address], [address] [place], [place] [address]   [facility] Health Urgent Care at [facility] Get Driving Directions  [phone] [address] [place], [place] [address]   [facility] Urgent Care at [facility] Directions [phone] [address], [address] [place], [place] [address]  Your MyChart E-visit questionnaire answers were reviewed by a board certified advanced clinical practitioner to complete your personal care plan based on your specific symptoms.  Thank you for using e-Visits.

## 2022-07-29 ENCOUNTER — Ambulatory Visit: Payer: Medicaid Other

## 2022-07-30 ENCOUNTER — Ambulatory Visit (INDEPENDENT_AMBULATORY_CARE_PROVIDER_SITE_OTHER): Payer: Medicaid Other

## 2022-07-30 VITALS — BP 134/82 | HR 54 | Ht 63.0 in | Wt 103.0 lb

## 2022-07-30 DIAGNOSIS — Z3042 Encounter for surveillance of injectable contraceptive: Secondary | ICD-10-CM

## 2022-07-30 MED ORDER — MEDROXYPROGESTERONE ACETATE 150 MG/ML IM SUSP
150.0000 mg | Freq: Once | INTRAMUSCULAR | Status: AC
  Administered 2022-07-30: 150 mg via INTRAMUSCULAR

## 2022-07-30 NOTE — Progress Notes (Signed)
  Date last pap: 08/07/2021. Last Depo-Provera: 05/08/2022. Side Effects if any: NONE. Serum HCG indicated? NA. Depo-Provera 150 mg IM given by: Georgana Curio in LD, tolerated well.  Next appointment due 08/13-27/2024.   Pt needs AEX.    Administrations This Visit     medroxyPROGESTERone (DEPO-PROVERA) injection 150 mg     Admin Date 07/30/2022 Action Given Dose 150 mg Route Intramuscular Administered By Maretta Bees, RMA

## 2022-09-12 ENCOUNTER — Ambulatory Visit: Payer: Medicaid Other | Admitting: Obstetrics and Gynecology

## 2022-10-23 ENCOUNTER — Ambulatory Visit: Payer: Medicaid Other

## 2022-10-31 ENCOUNTER — Ambulatory Visit (INDEPENDENT_AMBULATORY_CARE_PROVIDER_SITE_OTHER): Payer: Medicaid Other | Admitting: *Deleted

## 2022-10-31 VITALS — BP 119/71 | HR 51

## 2022-10-31 DIAGNOSIS — Z3202 Encounter for pregnancy test, result negative: Secondary | ICD-10-CM | POA: Diagnosis not present

## 2022-10-31 DIAGNOSIS — Z3042 Encounter for surveillance of injectable contraceptive: Secondary | ICD-10-CM | POA: Diagnosis not present

## 2022-10-31 LAB — POCT URINE PREGNANCY: Preg Test, Ur: NEGATIVE

## 2022-10-31 MED ORDER — MEDROXYPROGESTERONE ACETATE 150 MG/ML IM SUSP
150.0000 mg | Freq: Once | INTRAMUSCULAR | Status: AC
Start: 2022-10-31 — End: 2022-10-31
  Administered 2022-10-31: 150 mg via INTRAMUSCULAR

## 2022-10-31 NOTE — Progress Notes (Signed)
Date last pap: 08/07/21. Last Depo-Provera: 07/30/22. Side Effects if any: NA. Serum HCG indicated? Negative. Depo-Provera 150 mg IM given by: Montez Morita, RN in RD. Next appointment due 01/16/23-01/30/23.

## 2023-01-16 ENCOUNTER — Ambulatory Visit: Payer: Medicaid Other

## 2023-01-22 ENCOUNTER — Ambulatory Visit (INDEPENDENT_AMBULATORY_CARE_PROVIDER_SITE_OTHER): Payer: Medicaid Other

## 2023-01-22 DIAGNOSIS — Z3042 Encounter for surveillance of injectable contraceptive: Secondary | ICD-10-CM

## 2023-01-22 MED ORDER — MEDROXYPROGESTERONE ACETATE 150 MG/ML IM SUSP
150.0000 mg | Freq: Once | INTRAMUSCULAR | Status: AC
Start: 2023-01-22 — End: 2023-01-22
  Administered 2023-01-22: 150 mg via INTRAMUSCULAR

## 2023-01-22 NOTE — Progress Notes (Signed)
Date last pap: 08/07/21. Last Depo-Provera: 10/31/22. Side Effects if any: None Serum HCG indicated? N/A. Depo-Provera 150 mg IM given by: Jesusmanuel Erbes RN,BSN Given in LD. Patient tolerated well. Next appointment due Feb 5-Feb19.

## 2023-03-11 ENCOUNTER — Encounter: Payer: Self-pay | Admitting: Family Medicine

## 2023-03-11 ENCOUNTER — Other Ambulatory Visit (HOSPITAL_COMMUNITY)
Admission: RE | Admit: 2023-03-11 | Discharge: 2023-03-11 | Disposition: A | Discharge status: Home / Self Care | Payer: Medicaid Other | Source: Ambulatory Visit | Attending: Family Medicine | Admitting: Family Medicine

## 2023-03-11 ENCOUNTER — Ambulatory Visit: Payer: Medicaid Other | Admitting: Family Medicine

## 2023-03-11 VITALS — BP 134/69 | HR 56 | Ht 64.0 in | Wt 102.0 lb

## 2023-03-11 DIAGNOSIS — Z113 Encounter for screening for infections with a predominantly sexual mode of transmission: Secondary | ICD-10-CM | POA: Insufficient documentation

## 2023-03-11 DIAGNOSIS — Z1339 Encounter for screening examination for other mental health and behavioral disorders: Secondary | ICD-10-CM | POA: Diagnosis not present

## 2023-03-11 DIAGNOSIS — Z01419 Encounter for gynecological examination (general) (routine) without abnormal findings: Secondary | ICD-10-CM | POA: Insufficient documentation

## 2023-03-11 DIAGNOSIS — Z309 Encounter for contraceptive management, unspecified: Secondary | ICD-10-CM | POA: Diagnosis not present

## 2023-03-11 DIAGNOSIS — Z1159 Encounter for screening for other viral diseases: Secondary | ICD-10-CM | POA: Diagnosis not present

## 2023-03-11 NOTE — Assessment & Plan Note (Signed)
 Patient currently on Depo.  Is, Depo since 2016.  Discussed recommended long-term use of Depo-Provera .  Discussed options for other forms of birth control and patient has been to this.  Will consider and return if she desires a different type of birth control.  No further questions or concerns.

## 2023-03-11 NOTE — Assessment & Plan Note (Signed)
 No known contact with STDs.  Has partner with multiple sexual partners would like to be checked.  Would like blood testing as well.  Denies any symptoms of STI.  Swabs collected.  Ordered.  Will follow-up and treat as needed.

## 2023-03-11 NOTE — Progress Notes (Signed)
 28 y.o. GYN presents for AEX/STD screening.  Reports no concerns today.

## 2023-03-11 NOTE — Progress Notes (Signed)
    SUBJECTIVE:   CHIEF COMPLAINT / HPI:   STD screening exam Patient presents for evaluation for screening for STDs.  Reports that her boyfriend has multiple sexual partners and she must make sure that she has not called for an STD.  No known contacts with STDs and reports that her boyfriend has not tested positive for anything that she knows of.  Would like swabs as well as blood testing.  Contraceptive management Patient currently on Depo.  Has been on Depo since 2016.  Counseled at she should consider other forms given how long she has been on the Depo.  Patient is interested in more long acting birth controls.  Discussed Nexplanon  and IUD.  PERTINENT  PMH / PSH: None  OBJECTIVE:   BP 134/69   Pulse (!) 56   Ht 5' 4 (1.626 m)   Wt 102 lb (46.3 kg)   BMI 17.51 kg/m   General: Alert, well-oriented Cardiac: Regular rate Respiratory: Breathing, speaking full sentences GU: Normal external female genitalia, normal vaginal discharge, chaperone present MSK: Understanding  ASSESSMENT/PLAN:   Encounter for contraceptive management Patient currently on Depo.  Is, Depo since 2016.  Discussed recommended long-term use of Depo-Provera .  Discussed options for other forms of birth control and patient has been to this.  Will consider and return if she desires a different type of birth control.  No further questions or concerns.  Screening examination for STD (sexually transmitted disease) No known contact with STDs.  Has partner with multiple sexual partners would like to be checked.  Would like blood testing as well.  Denies any symptoms of STI.  Swabs collected.  Ordered.  Will follow-up and treat as needed.     Shawnae Leiva V Earla Charlie, MD [facility]

## 2023-03-12 LAB — CERVICOVAGINAL ANCILLARY ONLY
Bacterial Vaginitis (gardnerella): POSITIVE — AB
Candida Glabrata: NEGATIVE
Candida Vaginitis: NEGATIVE
Chlamydia: NEGATIVE
Comment: NEGATIVE
Comment: NEGATIVE
Comment: NEGATIVE
Comment: NEGATIVE
Comment: NEGATIVE
Comment: NORMAL
Neisseria Gonorrhea: NEGATIVE
Trichomonas: NEGATIVE

## 2023-03-12 LAB — HEPATITIS B SURFACE ANTIGEN: Hepatitis B Surface Ag: NEGATIVE

## 2023-03-12 LAB — RPR: RPR Ser Ql: NONREACTIVE

## 2023-03-12 LAB — HIV ANTIBODY (ROUTINE TESTING W REFLEX): HIV Screen 4th Generation wRfx: NONREACTIVE

## 2023-03-12 LAB — HEPATITIS C ANTIBODY: Hep C Virus Ab: NONREACTIVE

## 2023-03-18 MED ORDER — METRONIDAZOLE 500 MG PO TABS: 500.0000 mg | ORAL_TABLET | Freq: Two times a day (BID) | ORAL | 0 refills | Status: DC

## 2023-03-18 NOTE — Addendum Note (Signed)
 Addended by: Celedonio Savage on: 03/18/2023 04:43 PM   Modules accepted: Orders

## 2023-04-13 ENCOUNTER — Other Ambulatory Visit: Payer: Self-pay | Admitting: Obstetrics

## 2023-04-13 DIAGNOSIS — Z30013 Encounter for initial prescription of injectable contraceptive: Secondary | ICD-10-CM

## 2023-04-15 ENCOUNTER — Ambulatory Visit: Payer: Medicaid Other

## 2023-04-30 ENCOUNTER — Ambulatory Visit: Payer: Medicaid Other

## 2023-04-30 VITALS — BP 117/77 | HR 77 | Ht 64.0 in | Wt 104.0 lb

## 2023-04-30 DIAGNOSIS — Z3202 Encounter for pregnancy test, result negative: Secondary | ICD-10-CM

## 2023-04-30 DIAGNOSIS — Z3042 Encounter for surveillance of injectable contraceptive: Secondary | ICD-10-CM

## 2023-04-30 LAB — POCT URINE PREGNANCY: Preg Test, Ur: NEGATIVE

## 2023-04-30 MED ORDER — MEDROXYPROGESTERONE ACETATE 150 MG/ML IM SUSP
150.0000 mg | Freq: Once | INTRAMUSCULAR | Status: AC
Start: 2023-04-30 — End: 2023-04-30
  Administered 2023-04-30: 150 mg via INTRAMUSCULAR

## 2023-04-30 NOTE — Progress Notes (Signed)
 Date last pap: 08-07-21. Last Depo-Provera: 01-22-23. Side Effects if any: N/A pt tolerated well. Serum HCG indicated? Negative UPT in office today. She is 7 days outside Depo window Depo-Provera 150 mg IM given by: Hope Pigeon, CMA in the RD per pt request. Next appointment due 5/14-5/28.   Pt wants to switch to Nexplanon. She will schedule appt for Nexplanon insertion.

## 2023-05-14 ENCOUNTER — Ambulatory Visit: Payer: Medicaid Other | Admitting: Obstetrics and Gynecology

## 2023-05-14 ENCOUNTER — Encounter: Payer: Self-pay | Admitting: Obstetrics and Gynecology

## 2023-05-14 VITALS — BP 123/72 | HR 53 | Ht 64.0 in | Wt 100.0 lb

## 2023-05-14 DIAGNOSIS — Z30017 Encounter for initial prescription of implantable subdermal contraceptive: Secondary | ICD-10-CM

## 2023-05-14 MED ORDER — ETONOGESTREL 68 MG ~~LOC~~ IMPL
68.0000 mg | DRUG_IMPLANT | Freq: Once | SUBCUTANEOUS | Status: AC
  Administered 2023-05-14: 68 mg via SUBCUTANEOUS

## 2023-05-14 NOTE — Progress Notes (Addendum)
 28 y.o. GYN presents for Nexplanon Insertion.  Pt is switching from DEPO Injection, she has been on the DEPO for 8 years.  Administrations This Visit     etonogestrel (NEXPLANON) implant 68 mg     Admin Date 05/14/2023 Action Given Dose 68 mg Route Subdermal Documented By Maretta Bees, RMA

## 2023-05-14 NOTE — Progress Notes (Signed)
     GYNECOLOGY OFFICE PROCEDURE NOTE  Monica Nash is a 28 y.o. [id number] here for  Nexplanon insertion.  Last pap smear was on 08/07/21 and was normal.  No other gynecologic concerns.  Nexplanon Insertion Procedure Patient identified, informed consent performed, consent signed.   Patient does understand that irregular bleeding is a very common side effect of this medication. She was advised to have backup contraception for one week after placement. Pregnancy test in clinic today was negative.  Appropriate time out taken.  Patient's left arm was prepped and draped in the usual sterile fashion. The ruler used to measure and mark insertion area.  Patient was prepped with alcohol swab and then injected with 3 ml of 1% lidocaine.  She was prepped with betadine, Nexplanon removed from packaging,  Device confirmed in needle, then inserted full length of needle and withdrawn per handbook instructions. Nexplanon was able to palpated in the patient's arm; patient palpated the insert herself. There was minimal blood loss.  Patient insertion site covered with guaze and a pressure bandage to reduce any bruising.  The patient tolerated the procedure well and was given post procedure instructions.   Of note, patient is very slim.  She has been informed she may actually see and feel the device. Monica Aloe, MD, FACOG Obstetrician & Gynecologist, [facility], [facility]

## 2023-06-24 ENCOUNTER — Ambulatory Visit
Admission: EM | Admit: 2023-06-24 | Discharge: 2023-06-24 | Disposition: A | Discharge status: Home / Self Care | Attending: Family Medicine | Admitting: Family Medicine

## 2023-06-24 DIAGNOSIS — Z711 Person with feared health complaint in whom no diagnosis is made: Secondary | ICD-10-CM | POA: Diagnosis not present

## 2023-06-24 LAB — POCT URINALYSIS DIP (MANUAL ENTRY)
Bilirubin, UA: NEGATIVE
Glucose, UA: NEGATIVE mg/dL
Ketones, POC UA: NEGATIVE mg/dL
Leukocytes, UA: NEGATIVE
Nitrite, UA: NEGATIVE
Protein Ur, POC: NEGATIVE mg/dL
Spec Grav, UA: 1.02 (ref 1.010–1.025)
Urobilinogen, UA: 0.2 U/dL
pH, UA: 7 (ref 5.0–8.0)

## 2023-06-24 LAB — POCT URINE PREGNANCY: Preg Test, Ur: NEGATIVE

## 2023-06-24 MED ORDER — FLUCONAZOLE 150 MG PO TABS: 150.0000 mg | ORAL_TABLET | ORAL | 0 refills | Status: AC | PRN

## 2023-06-24 NOTE — ED Triage Notes (Signed)
"  I am having vaginal irritation, also noticing some burning after peeing". "I also have a new partner and we didn't use protection so I need testing to include: blood work and swab". No vaginal discharge.

## 2023-06-24 NOTE — Discharge Instructions (Addendum)
 Your test results will be available within 3 to 4 days. You are having vaginal irritation and itching I will treat with Diflucan  take 1 tablet today and repeat in 3 days if irritation persists.  Office will reach out to you by phone and if unable to reach you we will send you a message via MyChart if any of your results are abnormal advise you have any treatment needed.

## 2023-06-24 NOTE — ED Provider Notes (Signed)
 [name]    [name]: [id number] Arrival date & time: 06/24/23  1300      History   Chief Complaint Chief Complaint  Patient presents with   SEXUALLY TRANSMITTED DISEASE    Testing   Vaginitis    HPI Monica Nash is a 27 y.o. female.   HPI Here for STD testing.  Patient reports having unprotected sex and although she is not aware of any known exposure would like to take precautions to have routine STD testing completed.  She endorses some vaginal itching and irritation following recent sexual intercourse.  Denies any abnormal vaginal discharge.  No nausea or vomiting.  No LMP recorded (lmp unknown). Patient has had an implant.   Past Medical History:  Diagnosis Date   Anemia    Pregnant    S/P primary low transverse C-section 01/27/2015   UTI (urinary tract infection)     Patient Active Problem List   Diagnosis Date Noted   Screening examination for STD (sexually transmitted disease) 03/11/2023   Encounter for contraceptive management 03/11/2023   Indication for care in labor and delivery, antepartum 02/13/2022   Fetal growth restriction antepartum 01/02/2022   History of cesarean section 08/07/2021   Supervision of other normal pregnancy, antepartum 07/23/2021    Past Surgical History:  Procedure Laterality Date   CESAREAN SECTION N/A 01/27/2015   Procedure: CESAREAN SECTION;  Surgeon: Malka Sea, DO;  Location: [facility];  Service: Obstetrics;  Laterality: N/A;   CHOLECYSTECTOMY N/A 11/11/2017   Procedure: LAPAROSCOPIC CHOLECYSTECTOMY WITH INTRAOPERATIVE CHOLANGIOGRAM;  Surgeon: Joyce Nixon, MD;  Location: [facility];  Service: General;  Laterality: N/A;    OB History     Gravida  4   Para  2   Term  2   Preterm      AB  2   Living  2      SAB  2   IAB      Ectopic      Multiple  0   Live Births  2            Home Medications    Prior to Admission medications   Medication Sig Start Date End Date Taking? Authorizing  Provider  etonogestrel  (NEXPLANON ) 68 MG IMPL implant 1 each by Subdermal route once.   Yes [provider]  fluconazole  (DIFLUCAN ) 150 MG tablet Take 1 tablet PO once. Repeat in 3 days if needed. Patient not taking: Reported on 03/11/2023 07/03/22   Farris Hong, PA-C  metroNIDAZOLE  (FLAGYL ) 500 MG tablet Take 1 tablet (500 mg total) by mouth 2 (two) times daily. Patient not taking: Reported on 04/30/2023 03/18/23   Cresenzo, John V, MD    Family History Family History  Problem Relation Age of Onset   Hypertension Mother    Diabetes Sister    Cancer Sister    Cancer Maternal Grandmother    Asthma Neg Hx    Heart disease Neg Hx    Stroke Neg Hx     Social History Social History   Tobacco Use   Smoking status: Former    Types: Cigarettes   Smokeless tobacco: Never  Vaping Use   Vaping status: Former   Substances: Nicotine, Flavoring  Substance Use Topics   Alcohol use: Yes    Comment: socially   Drug use: Yes    Types: Marijuana    Comment: stopped after confirmed pregnancy     Allergies   Patient has no known allergies.  Review of Systems Review of Systems Pertinent negatives listed in HPI   Physical Exam Triage Vital Signs ED Triage Vitals  Encounter Vitals Group     BP 06/24/23 1319 (!) 102/59     Systolic BP Percentile --      Diastolic BP Percentile --      Pulse Rate 06/24/23 1319 70     Resp 06/24/23 1319 16     Temp 06/24/23 1319 98.6 F (37 C)     Temp Source 06/24/23 1319 Oral     SpO2 06/24/23 1319 98 %     Weight 06/24/23 1317 95 lb (43.1 kg)     Height 06/24/23 1317 5\' 4"  (1.626 m)     Head Circumference --      Peak Flow --      Pain Score 06/24/23 1314 0     Pain Loc --      Pain Education --      Exclude from Growth Chart --    No data found.  Updated Vital Signs BP (!) 102/59 (BP Location: Left Arm)   Pulse 70   Temp 98.6 F (37 C) (Oral)   Resp 16   Ht 5\' 4"  (1.626 m)   Wt 95 lb (43.1 kg)   LMP  (LMP Unknown)    SpO2 98%   BMI 16.31 kg/m   Visual Acuity Right Eye Distance:   Left Eye Distance:   Bilateral Distance:    Right Eye Near:   Left Eye Near:    Bilateral Near:     Physical Exam General appearance: alert, well developed, well nourished, cooperative and in no distress Head: Normocephalic, without obvious abnormality, atraumatic Heart: Rate and rhythm normal.   Respiratory: Respirations even and unlabored, normal respiratory rate Extremities: No gross deformities Skin: Skin color, texture, turgor normal. No rashes seen  Vaginal cytology self collected UC Treatments / Results  Labs (all labs ordered are listed, but only abnormal results are displayed) Labs Reviewed  POCT URINALYSIS DIP (MANUAL ENTRY) - Abnormal; Notable for the following components:      Result Value   Blood, UA small (*)    All other components within normal limits  POCT URINE PREGNANCY - Normal  HIV ANTIBODY (ROUTINE TESTING W REFLEX)  RPR  CERVICOVAGINAL ANCILLARY ONLY    EKG   Radiology No results found.  Procedures Procedures (including critical care time)  Medications Ordered in UC Medications - No data to display  Initial Impression / Assessment and Plan / UC Course  I have reviewed the triage vital signs and the nursing notes.  Pertinent labs & imaging results that were available during my care of the patient were reviewed by me and considered in my medical decision making (see chart for details).    Routine STD testing.  Given vaginitis symptoms will trial Diflucan  1 tablet today and may repeat in 3 days if vaginal itching persists.  HIV and RPR pending.  Patient advised it may take up to 3 to 4 days for results to be available.  Our office will only reach out to her via phone if treatment is warranted.  Urine pregnancy negative.  UA unremarkable.  Patient verbalized understanding and agreement with plan. Final Clinical Impressions(s) / UC Diagnoses   Final diagnoses:  Concern about STD  in female without diagnosis   Discharge Instructions   None    ED Prescriptions   None    PDMP not reviewed this encounter.   Buena Carmine,  NP 06/24/23 1622

## 2023-06-25 LAB — HIV ANTIBODY (ROUTINE TESTING W REFLEX): HIV Screen 4th Generation wRfx: NONREACTIVE

## 2023-06-25 LAB — CERVICOVAGINAL ANCILLARY ONLY
Bacterial Vaginitis (gardnerella): NEGATIVE
Candida Glabrata: NEGATIVE
Candida Vaginitis: NEGATIVE
Chlamydia: NEGATIVE
Comment: NEGATIVE
Comment: NEGATIVE
Comment: NEGATIVE
Comment: NEGATIVE
Comment: NEGATIVE
Comment: NORMAL
Neisseria Gonorrhea: NEGATIVE
Trichomonas: NEGATIVE

## 2023-06-25 LAB — RPR: RPR Ser Ql: NONREACTIVE

## 2023-10-21 ENCOUNTER — Ambulatory Visit: Payer: Self-pay

## 2023-10-22 ENCOUNTER — Ambulatory Visit
Admission: RE | Admit: 2023-10-22 | Discharge: 2023-10-22 | Disposition: A | Discharge status: Home / Self Care | Source: Ambulatory Visit | Attending: Physician Assistant | Admitting: Physician Assistant

## 2023-10-22 VITALS — BP 121/62 | HR 56 | Temp 98.1°F | Resp 19

## 2023-10-22 DIAGNOSIS — Z113 Encounter for screening for infections with a predominantly sexual mode of transmission: Secondary | ICD-10-CM | POA: Diagnosis not present

## 2023-10-22 DIAGNOSIS — N898 Other specified noninflammatory disorders of vagina: Secondary | ICD-10-CM | POA: Insufficient documentation

## 2023-10-22 MED ORDER — OMEPRAZOLE 20 MG PO CPDR
20.0000 mg | DELAYED_RELEASE_CAPSULE | Freq: Every day | ORAL | 0 refills | Status: AC
Start: 2023-10-22 — End: ?

## 2023-10-22 NOTE — ED Triage Notes (Addendum)
 Pt presents to uc with co cp for 2 days that is sore and achy in nature and in the center of the chest.  Pt reports pain is worse when she is hungry, no nausea or vomiting. Is a smoker. Pt reports pain comes and goes.   Pt also endorses vaginal discharge and need for sti check.

## 2023-10-22 NOTE — ED Provider Notes (Signed)
 [name]    [name]: [id number] Arrival date & time: 10/22/23  1208      History   Chief Complaint Chief Complaint  Patient presents with   Vaginal Discharge    Chest pain and shoulder - Entered by patient   Chest Pain    HPI Monica Nash is a 28 y.o. female.   The history is provided by the patient.  Vaginal Discharge Chest Pain   Past Medical History:  Diagnosis Date   Anemia    Pregnant    S/P primary low transverse C-section 01/27/2015   UTI (urinary tract infection)     Patient Active Problem List   Diagnosis Date Noted   Screening examination for STD (sexually transmitted disease) 03/11/2023   Encounter for contraceptive management 03/11/2023   Indication for care in labor and delivery, antepartum 02/13/2022   Fetal growth restriction antepartum 01/02/2022   History of cesarean section 08/07/2021   Supervision of other normal pregnancy, antepartum 07/23/2021    Past Surgical History:  Procedure Laterality Date   CESAREAN SECTION N/A 01/27/2015   Procedure: CESAREAN SECTION;  Surgeon: Lang JINNY Peel, DO;  Location: [facility];  Service: Obstetrics;  Laterality: N/A;   CHOLECYSTECTOMY N/A 11/11/2017   Procedure: LAPAROSCOPIC CHOLECYSTECTOMY WITH INTRAOPERATIVE CHOLANGIOGRAM;  Surgeon: Debby Hila, MD;  Location: [facility];  Service: General;  Laterality: N/A;    OB History     Gravida  4   Para  2   Term  2   Preterm      AB  2   Living  2      SAB  2   IAB      Ectopic      Multiple  0   Live Births  2            Home Medications    Prior to Admission medications   Medication Sig Start Date End Date Taking? Authorizing Provider  omeprazole  (PRILOSEC) 20 MG capsule Take 1 capsule (20 mg total) by mouth daily. 10/22/23  Yes Billy Asberry FALCON, PA-C  etonogestrel  (NEXPLANON ) 68 MG IMPL implant 1 each by Subdermal route once.    [provider]  fluconazole  (DIFLUCAN ) 150 MG tablet Take 1 tablet (150 mg total)  by mouth every three (3) days as needed. Repeat if needed 06/24/23   Arloa Suzen RAMAN, NP  metroNIDAZOLE  (FLAGYL ) 500 MG tablet Take 1 tablet (500 mg total) by mouth 2 (two) times daily. Patient not taking: Reported on 04/30/2023 03/18/23   Cresenzo, John V, MD    Family History Family History  Problem Relation Age of Onset   Hypertension Mother    Diabetes Sister    Cancer Sister    Cancer Maternal Grandmother    Asthma Neg Hx    Heart disease Neg Hx    Stroke Neg Hx     Social History Social History   Tobacco Use   Smoking status: Some Days    Types: Cigarettes, Cigars   Smokeless tobacco: Never  Vaping Use   Vaping status: Former   Substances: Nicotine, Flavoring  Substance Use Topics   Alcohol use: Yes    Comment: socially   Drug use: Yes    Types: Marijuana     Allergies   Patient has no known allergies.   Review of Systems Review of Systems  Cardiovascular:  Positive for chest pain.  Genitourinary:  Positive for vaginal discharge.     Physical Exam Triage Vital Signs ED Triage  Vitals  Encounter Vitals Group     BP 10/22/23 1240 121/62     Girls Systolic BP Percentile --      Girls Diastolic BP Percentile --      Boys Systolic BP Percentile --      Boys Diastolic BP Percentile --      Pulse Rate 10/22/23 1239 (!) 56     Resp 10/22/23 1239 19     Temp 10/22/23 1239 98.1 F (36.7 C)     Temp src --      SpO2 10/22/23 1239 98 %     Weight --      Height --      Head Circumference --      Peak Flow --      Pain Score 10/22/23 1239 0     Pain Loc --      Pain Education --      Exclude from Growth Chart --    No data found.  Updated Vital Signs BP 121/62   Pulse (!) 56   Temp 98.1 F (36.7 C)   Resp 19   LMP  (Approximate)   SpO2 98%   Visual Acuity Right Eye Distance:   Left Eye Distance:   Bilateral Distance:    Right Eye Near:   Left Eye Near:    Bilateral Near:     Physical Exam   UC Treatments / Results  Labs (all labs  ordered are listed, but only abnormal results are displayed) Labs Reviewed  RPR  HIV ANTIBODY (ROUTINE TESTING W REFLEX)  CERVICOVAGINAL ANCILLARY ONLY    EKG   Radiology No results found.  Procedures Procedures (including critical care time)  Medications Ordered in UC Medications - No data to display  Initial Impression / Assessment and Plan / UC Course  I have reviewed the triage vital signs and the nursing notes.  Pertinent labs & imaging results that were available during my care of the patient were reviewed by me and considered in my medical decision making (see chart for details).     *** Final Clinical Impressions(s) / UC Diagnoses   Final diagnoses:  Vaginal discharge  Screening examination for STD (sexually transmitted disease)   Discharge Instructions   None    ED Prescriptions     Medication Sig Dispense Auth. Provider   omeprazole  (PRILOSEC) 20 MG capsule Take 1 capsule (20 mg total) by mouth daily. 30 capsule Billy Asberry FALCON, PA-C      PDMP not reviewed this encounter.

## 2023-10-24 ENCOUNTER — Ambulatory Visit (HOSPITAL_COMMUNITY): Payer: Self-pay

## 2023-10-24 LAB — CERVICOVAGINAL ANCILLARY ONLY
Bacterial Vaginitis (gardnerella): POSITIVE — AB
Candida Glabrata: NEGATIVE
Candida Vaginitis: NEGATIVE
Chlamydia: NEGATIVE
Comment: NEGATIVE
Comment: NEGATIVE
Comment: NEGATIVE
Comment: NEGATIVE
Comment: NEGATIVE
Comment: NORMAL
Neisseria Gonorrhea: NEGATIVE
Trichomonas: NEGATIVE

## 2023-10-24 LAB — RPR: RPR Ser Ql: NONREACTIVE

## 2023-10-24 LAB — HIV ANTIBODY (ROUTINE TESTING W REFLEX): HIV Screen 4th Generation wRfx: NONREACTIVE

## 2023-10-24 MED ORDER — METRONIDAZOLE 500 MG PO TABS: 500.0000 mg | ORAL_TABLET | Freq: Two times a day (BID) | ORAL | 0 refills | Status: DC

## 2024-02-16 ENCOUNTER — Ambulatory Visit
Admission: EM | Admit: 2024-02-16 | Discharge: 2024-02-16 | Disposition: A | Discharge status: Home / Self Care | Attending: Family Medicine | Admitting: Family Medicine

## 2024-02-16 ENCOUNTER — Ambulatory Visit

## 2024-02-16 DIAGNOSIS — S6991XA Unspecified injury of right wrist, hand and finger(s), initial encounter: Secondary | ICD-10-CM

## 2024-02-16 MED ORDER — CEPHALEXIN 500 MG PO CAPS: 500.0000 mg | ORAL_CAPSULE | Freq: Three times a day (TID) | ORAL | 0 refills | Status: AC

## 2024-02-16 NOTE — ED Triage Notes (Signed)
 Patient reports accidentally smashing fingers/right hand in a door on Saturday. Initially caused broken area/skin on right middle finger/knuckle but continued pain/swelling, decreased ROM.

## 2024-02-16 NOTE — Discharge Instructions (Signed)
 You were seen today for a finger injury.  I do not see any fracture on the xray.  However, if the radiologist reads this differently you will be notified.  I have sent out an antibiotic, and given you a finger splint for comfort.  This should help the healing over the knuckle as well.  If you continue with problems, you may wish to see a hand specialist.  You may follow up with [facility] at [phone].

## 2024-02-16 NOTE — ED Provider Notes (Signed)
 [name]    [name]: [id number] Arrival date & time: 02/16/24  1228      History   Chief Complaint Chief Complaint  Patient presents with   Injury    HPI Monica Nash is a 28 y.o. female.    Injury  Patient is here for right middle finger injury.  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  She initially had pain to the right 3rd-5th finger.  Now she is having pain and swelling only to the 3rd finger.  She thinks it is infected as there is a blister that I keep having to pop.       Past Medical History:  Diagnosis Date   Anemia    Pregnant    S/P primary low transverse C-section 01/27/2015   UTI (urinary tract infection)     Patient Active Problem List   Diagnosis Date Noted   Screening examination for STD (sexually transmitted disease) 03/11/2023   Encounter for contraceptive management 03/11/2023   Indication for care in labor and delivery, antepartum 02/13/2022   Fetal growth restriction antepartum 01/02/2022   History of cesarean section 08/07/2021   Supervision of other normal pregnancy, antepartum 07/23/2021    Past Surgical History:  Procedure Laterality Date   CESAREAN SECTION N/A 01/27/2015   Procedure: CESAREAN SECTION;  Surgeon: Lang JINNY Peel, DO;  Location: [facility];  Service: Obstetrics;  Laterality: N/A;   CHOLECYSTECTOMY N/A 11/11/2017   Procedure: LAPAROSCOPIC CHOLECYSTECTOMY WITH INTRAOPERATIVE CHOLANGIOGRAM;  Surgeon: Debby Hila, MD;  Location: [facility];  Service: General;  Laterality: N/A;    OB History     Gravida  4   Para  2   Term  2   Preterm      AB  2   Living  2      SAB  2   IAB      Ectopic      Multiple  0   Live Births  2            Home Medications    Prior to Admission medications  Medication Sig Start Date End Date Taking? Authorizing Provider  etonogestrel  (NEXPLANON ) 68 MG IMPL implant 1 each by Subdermal route once.   Yes [provider]  fluconazole  (DIFLUCAN ) 150 MG tablet Take 1 tablet (150 mg total) by mouth every three (3) days as needed. Repeat if needed 06/24/23   Arloa Suzen RAMAN, NP  metroNIDAZOLE  (FLAGYL ) 500 MG tablet Take 1 tablet (500 mg total) by mouth 2 (two) times daily. Patient not taking: Reported on 04/30/2023 03/18/23   Cresenzo, John V, MD  metroNIDAZOLE  (FLAGYL ) 500 MG tablet Take 1 tablet (500 mg total) by mouth 2 (two) times daily. 10/24/23   Vonna Sharlet POUR, MD  omeprazole  (PRILOSEC) 20 MG capsule Take 1 capsule (20 mg total) by mouth daily. 10/22/23   Billy Asberry FALCON, PA-C    Family History Family History  Problem Relation Age of Onset   Hypertension Mother    Diabetes Sister    Cancer Sister    Cancer Maternal Grandmother    Asthma Neg Hx    Heart disease Neg Hx    Stroke Neg Hx     Social History Social History[1]   Allergies   Patient has no known allergies.   Review of Systems Review of Systems  Constitutional: Negative.   HENT: Negative.    Respiratory: Negative.    Cardiovascular:  Negative.   Gastrointestinal: Negative.   Musculoskeletal:  Positive for arthralgias.     Physical Exam Triage Vital Signs ED Triage Vitals  Encounter Vitals Group     BP 02/16/24 1345 122/78     Girls Systolic BP Percentile --      Girls Diastolic BP Percentile --      Boys Systolic BP Percentile --      Boys Diastolic BP Percentile --      Pulse Rate 02/16/24 1345 60     Resp 02/16/24 1345 16     Temp 02/16/24 1345 98.4 F (36.9 C)     Temp Source 02/16/24 1345 Oral     SpO2 02/16/24 1345 98 %     Weight 02/16/24 1343 90 lb (40.8 kg)     Height 02/16/24 1343 5' 4 (1.626 m)     Head Circumference --      Peak Flow --      Pain Score 02/16/24 1341 6     Pain Loc --      Pain Education --      Exclude from Growth Chart --    No data found.  Updated Vital Signs BP 122/78 (BP Location: Left Arm)   Pulse 60   Temp 98.4 F (36.9 C) (Oral)   Resp 16   Ht 5' 4  (1.626 m)   Wt 40.8 kg   LMP  (LMP Unknown)   SpO2 98%   BMI 15.45 kg/m   Visual Acuity Right Eye Distance:   Left Eye Distance:   Bilateral Distance:    Right Eye Near:   Left Eye Near:    Bilateral Near:     Physical Exam Constitutional:      Appearance: Normal appearance. She is normal weight.  Musculoskeletal:     Comments: No TTP to the right hand or fingers other than the 3rd finger;  There is an open abrasion to the right 3rd PIP joint;  no bleeding or oozing noted;  she has TTP from the distal 3rd finger to the base of the finger;  able to bend, but causes pain  Neurological:     Mental Status: She is alert.      UC Treatments / Results  Labs (all labs ordered are listed, but only abnormal results are displayed) Labs Reviewed - No data to display  EKG   Radiology No results found.  Procedures Procedures (including critical care time)  Medications Ordered in UC Medications - No data to display  Initial Impression / Assessment and Plan / UC Course  I have reviewed the triage vital signs and the nursing notes.  Pertinent labs & imaging results that were available during my care of the patient were reviewed by me and considered in my medical decision making (see chart for details).    Final Clinical Impressions(s) / UC Diagnoses   Final diagnoses:  Injury of finger of right hand, initial encounter     Discharge Instructions      You were seen today for a finger injury.  I do not see any fracture on the xray.  However, if the radiologist reads this differently you will be notified.  I have sent out an antibiotic, and given you a finger splint for comfort.  This should help the healing over the knuckle as well.  If you continue with problems, you may wish to see a hand specialist.  You may follow up with [facility] at [phone].     ED  Prescriptions     Medication Sig Dispense Auth. Provider   cephALEXin  (KEFLEX ) 500 MG capsule Take 1 capsule  (500 mg total) by mouth 3 (three) times daily for 5 days. 15 capsule Darral Longs, MD      PDMP not reviewed this encounter.    [1]  Social History Tobacco Use   Smoking status: Some Days    Types: Cigarettes, Cigars   Smokeless tobacco: Never  Vaping Use   Vaping status: Former   Substances: Nicotine, Flavoring  Substance Use Topics   Alcohol use: Yes    Comment: socially   Drug use: Yes    Types: Marijuana     Darral Longs, MD 02/16/24 1409
